# Patient Record
Sex: Male | Born: 1935 | Race: White | Hispanic: No | Marital: Married | Smoking: Former smoker
Health system: Southern US, Community
[De-identification: ages and names within clinical notes are randomized; demographics above are authoritative.]

## PROBLEM LIST (undated history)

## (undated) DIAGNOSIS — N2889 Other specified disorders of kidney and ureter: Secondary | ICD-10-CM

## (undated) DIAGNOSIS — F0390 Unspecified dementia without behavioral disturbance: Secondary | ICD-10-CM

## (undated) DIAGNOSIS — K4090 Unilateral inguinal hernia, without obstruction or gangrene, not specified as recurrent: Secondary | ICD-10-CM

## (undated) DIAGNOSIS — E785 Hyperlipidemia, unspecified: Secondary | ICD-10-CM

## (undated) DIAGNOSIS — E039 Hypothyroidism, unspecified: Secondary | ICD-10-CM

## (undated) DIAGNOSIS — E7800 Pure hypercholesterolemia, unspecified: Secondary | ICD-10-CM

## (undated) HISTORY — PX: PARTIAL NEPHRECTOMY: SHX414

## (undated) HISTORY — DX: Unspecified dementia, unspecified severity, without behavioral disturbance, psychotic disturbance, mood disturbance, and anxiety: F03.90

## (undated) HISTORY — PX: PROSTATECTOMY: SHX69

## (undated) HISTORY — PX: APPENDECTOMY: SHX54

## (undated) HISTORY — PX: BACK SURGERY: SHX140

## (undated) HISTORY — DX: Unilateral inguinal hernia, without obstruction or gangrene, not specified as recurrent: K40.90

---

## 2003-08-26 ENCOUNTER — Encounter (INDEPENDENT_AMBULATORY_CARE_PROVIDER_SITE_OTHER): Payer: Self-pay | Admitting: Specialist

## 2003-08-27 ENCOUNTER — Ambulatory Visit (HOSPITAL_COMMUNITY): Admission: RE | Admit: 2003-08-27 | Discharge: 2003-08-27 | Payer: Self-pay | Admitting: Gastroenterology

## 2004-11-24 ENCOUNTER — Emergency Department (HOSPITAL_COMMUNITY): Admission: EM | Admit: 2004-11-24 | Discharge: 2004-11-24 | Payer: Self-pay | Admitting: Emergency Medicine

## 2009-09-09 ENCOUNTER — Encounter: Admission: RE | Admit: 2009-09-09 | Discharge: 2009-09-09 | Payer: Self-pay | Admitting: Family Medicine

## 2009-12-01 ENCOUNTER — Inpatient Hospital Stay (HOSPITAL_COMMUNITY): Admission: RE | Admit: 2009-12-01 | Discharge: 2009-12-04 | Payer: Self-pay | Admitting: Urology

## 2009-12-01 ENCOUNTER — Encounter (INDEPENDENT_AMBULATORY_CARE_PROVIDER_SITE_OTHER): Payer: Self-pay | Admitting: Urology

## 2010-05-27 ENCOUNTER — Ambulatory Visit (HOSPITAL_COMMUNITY): Admission: RE | Admit: 2010-05-27 | Discharge: 2010-05-27 | Payer: Self-pay | Admitting: Urology

## 2010-11-25 ENCOUNTER — Ambulatory Visit (HOSPITAL_COMMUNITY)
Admission: RE | Admit: 2010-11-25 | Discharge: 2010-11-25 | Payer: Self-pay | Source: Home / Self Care | Attending: Urology | Admitting: Urology

## 2011-02-07 LAB — CBC
HCT: 42.5 % (ref 39.0–52.0)
Hemoglobin: 14.5 g/dL (ref 13.0–17.0)
MCHC: 34 g/dL (ref 30.0–36.0)
MCV: 88 fL (ref 78.0–100.0)
Platelets: 241 10*3/uL (ref 150–400)
RBC: 4.83 MIL/uL (ref 4.22–5.81)
RDW: 14.4 % (ref 11.5–15.5)
WBC: 7.2 10*3/uL (ref 4.0–10.5)

## 2011-02-07 LAB — BASIC METABOLIC PANEL
BUN: 17 mg/dL (ref 6–23)
BUN: 7 mg/dL (ref 6–23)
CO2: 26 mEq/L (ref 19–32)
CO2: 26 mEq/L (ref 19–32)
Calcium: 8.2 mg/dL — ABNORMAL LOW (ref 8.4–10.5)
Calcium: 8.6 mg/dL (ref 8.4–10.5)
Chloride: 102 mEq/L (ref 96–112)
Chloride: 104 mEq/L (ref 96–112)
Creatinine, Ser: 1.21 mg/dL (ref 0.4–1.5)
Creatinine, Ser: 1.3 mg/dL (ref 0.4–1.5)
GFR calc Af Amer: >60 mL/min (ref >60)
GFR calc Af Amer: >60 mL/min (ref >60)
GFR calc non Af Amer: 54 mL/min — ABNORMAL LOW (ref >60)
GFR calc non Af Amer: 59 mL/min — ABNORMAL LOW (ref >60)
Glucose, Bld: 212 mg/dL — ABNORMAL HIGH (ref 70–99)
Glucose, Bld: 93 mg/dL (ref 70–99)
Potassium: 3.9 mEq/L (ref 3.5–5.1)
Potassium: 4.2 mEq/L (ref 3.5–5.1)
Sodium: 132 mEq/L — ABNORMAL LOW (ref 135–145)
Sodium: 136 mEq/L (ref 135–145)

## 2011-02-07 LAB — BASIC METABOLIC PANEL WITH GFR
BUN: 11 mg/dL (ref 6–23)
BUN: 14 mg/dL (ref 6–23)
BUN: 16 mg/dL (ref 6–23)
CO2: 25 meq/L (ref 19–32)
CO2: 27 meq/L (ref 19–32)
CO2: 29 meq/L (ref 19–32)
Calcium: 8.3 mg/dL — ABNORMAL LOW (ref 8.4–10.5)
Calcium: 8.4 mg/dL (ref 8.4–10.5)
Calcium: 9.2 mg/dL (ref 8.4–10.5)
Chloride: 105 meq/L (ref 96–112)
Chloride: 108 meq/L (ref 96–112)
Chloride: 98 meq/L (ref 96–112)
Creatinine, Ser: 1.15 mg/dL (ref 0.4–1.5)
Creatinine, Ser: 1.26 mg/dL (ref 0.4–1.5)
Creatinine, Ser: 1.3 mg/dL (ref 0.4–1.5)
GFR calc non Af Amer: 54 mL/min — ABNORMAL LOW
GFR calc non Af Amer: 56 mL/min — ABNORMAL LOW
GFR calc non Af Amer: >60 mL/min
Glucose, Bld: 100 mg/dL — ABNORMAL HIGH (ref 70–99)
Glucose, Bld: 119 mg/dL — ABNORMAL HIGH (ref 70–99)
Glucose, Bld: 119 mg/dL — ABNORMAL HIGH (ref 70–99)
Potassium: 3.6 meq/L (ref 3.5–5.1)
Potassium: 3.8 meq/L (ref 3.5–5.1)
Potassium: 4.5 meq/L (ref 3.5–5.1)
Sodium: 129 meq/L — ABNORMAL LOW (ref 135–145)
Sodium: 135 meq/L (ref 135–145)
Sodium: 142 meq/L (ref 135–145)

## 2011-02-07 LAB — HEMOGLOBIN AND HEMATOCRIT, BLOOD
HCT: 35.5 % — ABNORMAL LOW (ref 39.0–52.0)
HCT: 36.3 % — ABNORMAL LOW (ref 39.0–52.0)
Hemoglobin: 12 g/dL — ABNORMAL LOW (ref 13.0–17.0)
Hemoglobin: 12.1 g/dL — ABNORMAL LOW (ref 13.0–17.0)

## 2011-02-07 LAB — TYPE AND SCREEN
ABO/RH(D): O NEG
Antibody Screen: NEGATIVE

## 2011-02-07 LAB — ABO/RH: ABO/RH(D): O NEG

## 2011-02-07 LAB — CREATININE, FLUID (PLEURAL, PERITONEAL, JP DRAINAGE): Creat, Fluid: 1 mg/dL

## 2011-04-09 NOTE — Op Note (Signed)
   NAME:  Ralph Jordan, TISDEL NO.:  [id number]   MEDICAL RECORD NO.:  [id number]                   PATIENT TYPE:  AMB   LOCATION:  ENDO                                 FACILITY:  [facility]   PHYSICIAN:  Graylin Shiver, M.D.                DATE OF BIRTH:  July 02, 1933   DATE OF PROCEDURE:  08/27/2003  DATE OF DISCHARGE:                                 OPERATIVE REPORT   PROCEDURE:  Colonoscopy with polypectomy and biopsy.   INDICATION FOR PROCEDURE:  Screening.   Informed consent was obtained after explanation of the risks of bleeding,  infection, and perforation.   PREMEDICATION:  Fentanyl 50 mcg IV, Versed 5 mg IV.   DESCRIPTION OF PROCEDURE:  With the patient in the left lateral decubitus  position, a rectal exam was performed and no masses were felt.  The Olympus  colonoscope was inserted into the rectum and advanced around the colon to  the cecum.  Upon advancement of the scope, I saw a 6 mm polyp which was  sessile in the descending colon, which was snared and removed by snare  cautery technique.  The cautery site looked good and the polyp was  retrieved.  After reaching the cecum and cecal landmarks were identified, I  noted two polyps in the cecum.  One was at the base of the cecum and was  about 2-3 mm in size.  This was biopsied off with cold forceps.  Also in the  cecum was a 6 mm sessile polyp, which was snared with a mini-snare and  removed by snare cautery technique with the cautery site turned down for  purposes of being in the cecum.  In the ascending colon there were two  polyps, one was a 6 mm sessile polyp, which was snared and removed by snare  cautery technique.  The cautery site looked good.  The other was a small 2-3  mm polyp, which was removed with the cold forceps.  The transverse colon  looked normal.  The descending colon site of previous polypectomy looked  good.  The sigmoid and rectum looked normal.  He tolerated the procedure  well without complications.   IMPRESSION:  Multiple colon polyps.   PLAN:  The pathology will be checked.                                               Graylin Shiver, M.D.    Germain Osgood  D:  08/27/2003  T:  08/27/2003  Job:  [id number]   cc:   Vikki Ports, M.D.  [address] [place]  [place]  [place] [address]  Fax: [phone]

## 2011-06-02 ENCOUNTER — Other Ambulatory Visit (HOSPITAL_COMMUNITY): Payer: Self-pay | Admitting: Urology

## 2011-06-02 ENCOUNTER — Ambulatory Visit (HOSPITAL_COMMUNITY)
Admission: RE | Admit: 2011-06-02 | Discharge: 2011-06-02 | Disposition: A | Discharge status: Home / Self Care | Payer: Medicare Other | Source: Ambulatory Visit | Attending: Urology | Admitting: Urology

## 2011-06-02 DIAGNOSIS — I517 Cardiomegaly: Secondary | ICD-10-CM | POA: Insufficient documentation

## 2011-06-02 DIAGNOSIS — R0602 Shortness of breath: Secondary | ICD-10-CM | POA: Insufficient documentation

## 2011-06-02 DIAGNOSIS — Z87891 Personal history of nicotine dependence: Secondary | ICD-10-CM | POA: Insufficient documentation

## 2011-06-02 DIAGNOSIS — R109 Unspecified abdominal pain: Secondary | ICD-10-CM | POA: Insufficient documentation

## 2011-06-02 DIAGNOSIS — Z85528 Personal history of other malignant neoplasm of kidney: Secondary | ICD-10-CM | POA: Insufficient documentation

## 2011-12-02 ENCOUNTER — Ambulatory Visit (HOSPITAL_COMMUNITY)
Admission: RE | Admit: 2011-12-02 | Discharge: 2011-12-02 | Disposition: A | Discharge status: Home / Self Care | Payer: Medicare Other | Source: Ambulatory Visit | Attending: Urology | Admitting: Urology

## 2011-12-02 ENCOUNTER — Other Ambulatory Visit (HOSPITAL_COMMUNITY): Payer: Self-pay | Admitting: Urology

## 2011-12-02 DIAGNOSIS — I10 Essential (primary) hypertension: Secondary | ICD-10-CM | POA: Insufficient documentation

## 2011-12-02 DIAGNOSIS — F17200 Nicotine dependence, unspecified, uncomplicated: Secondary | ICD-10-CM | POA: Insufficient documentation

## 2011-12-02 DIAGNOSIS — C649 Malignant neoplasm of unspecified kidney, except renal pelvis: Secondary | ICD-10-CM

## 2011-12-03 MED ORDER — DIPHENHYDRAMINE HCL 50 MG/ML IJ SOLN
INTRAMUSCULAR | Status: AC
  Filled 2011-12-03: qty 1

## 2011-12-03 MED ORDER — FENTANYL CITRATE 0.05 MG/ML IJ SOLN
INTRAMUSCULAR | Status: AC
  Filled 2011-12-03: qty 2

## 2011-12-03 MED ORDER — MIDAZOLAM HCL 10 MG/2ML IJ SOLN
INTRAMUSCULAR | Status: AC
  Filled 2011-12-03: qty 2

## 2012-03-08 ENCOUNTER — Emergency Department (HOSPITAL_COMMUNITY): Payer: Medicare Other

## 2012-03-08 ENCOUNTER — Emergency Department (HOSPITAL_COMMUNITY)
Admission: EM | Admit: 2012-03-08 | Discharge: 2012-03-08 | Disposition: A | Discharge status: Home / Self Care | Payer: Medicare Other | Attending: Emergency Medicine | Admitting: Emergency Medicine

## 2012-03-08 ENCOUNTER — Encounter (HOSPITAL_COMMUNITY): Payer: Self-pay | Admitting: Emergency Medicine

## 2012-03-08 DIAGNOSIS — E039 Hypothyroidism, unspecified: Secondary | ICD-10-CM | POA: Insufficient documentation

## 2012-03-08 DIAGNOSIS — G319 Degenerative disease of nervous system, unspecified: Secondary | ICD-10-CM | POA: Insufficient documentation

## 2012-03-08 DIAGNOSIS — E7800 Pure hypercholesterolemia, unspecified: Secondary | ICD-10-CM | POA: Insufficient documentation

## 2012-03-08 DIAGNOSIS — H81399 Other peripheral vertigo, unspecified ear: Secondary | ICD-10-CM | POA: Insufficient documentation

## 2012-03-08 DIAGNOSIS — Z79899 Other long term (current) drug therapy: Secondary | ICD-10-CM | POA: Insufficient documentation

## 2012-03-08 DIAGNOSIS — E785 Hyperlipidemia, unspecified: Secondary | ICD-10-CM | POA: Insufficient documentation

## 2012-03-08 DIAGNOSIS — R209 Unspecified disturbances of skin sensation: Secondary | ICD-10-CM | POA: Insufficient documentation

## 2012-03-08 HISTORY — DX: Other specified disorders of kidney and ureter: N28.89

## 2012-03-08 HISTORY — DX: Hypothyroidism, unspecified: E03.9

## 2012-03-08 HISTORY — DX: Hyperlipidemia, unspecified: E78.5

## 2012-03-08 HISTORY — DX: Pure hypercholesterolemia, unspecified: E78.00

## 2012-03-08 LAB — CBC
HCT: 43.3 % (ref 39.0–52.0)
Hemoglobin: 14.7 g/dL (ref 13.0–17.0)
MCH: 29.6 pg (ref 26.0–34.0)
MCHC: 33.9 g/dL (ref 30.0–36.0)
MCV: 87.1 fL (ref 78.0–100.0)
Platelets: 267 10*3/uL (ref 150–400)
RBC: 4.97 MIL/uL (ref 4.22–5.81)
RDW: 14.4 % (ref 11.5–15.5)
WBC: 5.5 10*3/uL (ref 4.0–10.5)

## 2012-03-08 LAB — BASIC METABOLIC PANEL
BUN: 17 mg/dL (ref 6–23)
CO2: 26 mEq/L (ref 19–32)
Calcium: 9.6 mg/dL (ref 8.4–10.5)
Chloride: 104 mEq/L (ref 96–112)
Creatinine, Ser: 1.29 mg/dL (ref 0.50–1.35)
GFR calc Af Amer: 60 mL/min — ABNORMAL LOW (ref >90)
GFR calc non Af Amer: 52 mL/min — ABNORMAL LOW (ref >90)
Glucose, Bld: 96 mg/dL (ref 70–99)
Potassium: 4.2 mEq/L (ref 3.5–5.1)
Sodium: 139 mEq/L (ref 135–145)

## 2012-03-08 LAB — DIFFERENTIAL
Basophils Absolute: 0.1 10*3/uL (ref 0.0–0.1)
Basophils Relative: 2 % — ABNORMAL HIGH (ref 0–1)
Eosinophils Absolute: 0.3 10*3/uL (ref 0.0–0.7)
Eosinophils Relative: 6 % — ABNORMAL HIGH (ref 0–5)
Lymphocytes Relative: 17 % (ref 12–46)
Lymphs Abs: 1 10*3/uL (ref 0.7–4.0)
Monocytes Absolute: 0.7 10*3/uL (ref 0.1–1.0)
Monocytes Relative: 12 % (ref 3–12)
Neutro Abs: 3.5 10*3/uL (ref 1.7–7.7)
Neutrophils Relative %: 63 % (ref 43–77)

## 2012-03-08 LAB — URINALYSIS, ROUTINE W REFLEX MICROSCOPIC
Bilirubin Urine: NEGATIVE
Glucose, UA: NEGATIVE mg/dL
Hgb urine dipstick: NEGATIVE
Ketones, ur: NEGATIVE mg/dL
Leukocytes, UA: NEGATIVE
Nitrite: NEGATIVE
Protein, ur: NEGATIVE mg/dL
Specific Gravity, Urine: 1.007 (ref 1.005–1.030)
Urobilinogen, UA: 0.2 mg/dL (ref 0.0–1.0)
pH: 6 (ref 5.0–8.0)

## 2012-03-08 LAB — TROPONIN I: Troponin I: <0.3 ng/mL (ref <0.30)

## 2012-03-08 MED ORDER — MECLIZINE HCL 25 MG PO TABS
50.0000 mg | ORAL_TABLET | Freq: Once | ORAL | Status: AC
  Administered 2012-03-08: 50 mg via ORAL
  Filled 2012-03-08: qty 2

## 2012-03-08 MED ORDER — SODIUM CHLORIDE 0.9 % IV SOLN
INTRAVENOUS | Status: DC
  Administered 2012-03-08: 09:00:00 via INTRAVENOUS

## 2012-03-08 MED ORDER — MECLIZINE HCL 25 MG PO TABS: 25.0000 mg | ORAL_TABLET | Freq: Three times a day (TID) | ORAL | Status: AC | PRN

## 2012-03-08 NOTE — Discharge Instructions (Signed)
RESOURCE GUIDE  Dental Problems  Patients with Medicaid: [facility]                     [facility] [address]                                           [address] Phone:  [phone]                                                  Phone:  [phone]  If unable to pay or uninsured, contact:  [facility] or [facility] to become qualified for the adult dental clinic.  Chronic Pain Problems Contact [facility]  [phone] Patients need to be referred by their primary care doctor.  Insufficient Money for Medicine Contact [employer]:  call "211" or [facility] 271-5999.  No Primary Care Doctor Call [facility]  832-8000 Other agencies that provide inexpensive medical care    [facility]  [phone]    [facility]  [phone]    [facility]  [phone]    Women[facility] Clinic  [phone]    [facility]  [phone]    [facility]  [phone]  Psychological Services [facility]  [phone] [facility]  [phone] [facility]   [phone] (emergency services [phone])  Substance Abuse Resources [facility]  336-882-2125 [facility] 336-784-9470 [facility] 336-285-9073 [facility] 336-845-3988 Residential & Outpatient Substance Abuse Program  800-659-3381  Abuse/Neglect [place] Child Abuse Hotline (336) 641-3795 [place] Child Abuse Hotline 800-378-5315 (After Hours)  Emergency Shelter [facility] (336) 271-5985  Maternity Homes [facility] (336) 275-9566 [facility] (704) 372-4663  MRSA Hotline #:   832-7006    [place] [facility]  [facility] [place] [facility]     [employer]                          [facility] [address] [place]                       [address]      [address]  [place]                                                [place]                            [place] Phone:  [phone]                                   Phone:  [phone]                 Phone:  [phone]  [facility] Phone:  [phone]    [facility] Child Abuse Hotline 330-077-0755 (346)172-2682 (After Hours)   Take the prescription as directed.  Call your regular medical doctor today to schedule a follow up appointment within the next 2 days.  Call the ENT doctor today to schedule a follow up appointment within the next week.  Return to the Emergency Department immediately sooner if worsening.

## 2012-03-08 NOTE — ED Provider Notes (Signed)
History     CSN: [id number]  Arrival date & time 03/08/12  0845   First MD Initiated Contact with Patient 03/08/12 [phone]      Chief Complaint  Patient presents with  . Dizziness   HPI Pt was seen at 0855.  Per pt, c/o sudden onset and persistence of constant "dizziness" that began after he woke up at 0600 this morning PTA.  Pt describes the "dizziness" as a sensation of movement and feeling "off balance" while walking.  States his symptoms worsen when he moves his head/eyes to the left, improves when laying or sitting still.  Pt also c/o gradual onset and persistence of constant right sided face "tingling" for the past several years.  Denies any change in this symptom today.  Endorses he has been seen in the ED and by his PMD for same approx 1 month ago, but does not remember what he was told it was.  Denies CP/palpitations, no SOB/cough, no fevers, no rash, no visual changes, no slurred speech, no facial droop, no focal motor weakness, no tingling/numbness in extremities, no syncope/near syncope, no head injury.    Past Medical History  Diagnosis Date  . Hyperlipidemia   . Left renal mass   . Hypothyroidism   . Hypercholesterolemia   . Hypothyroidism     Past Surgical History  Procedure Date  . Partial nephrectomy     left  . Appendectomy   . Back surgery   . Prostatectomy      History  Substance Use Topics  . Smoking status: Former Games developer  . Smokeless tobacco: Not on file  . Alcohol Use: Yes    Review of Systems ROS: Statement: All systems negative except as marked or noted in the HPI; Constitutional: Negative for fever and chills. ; ; Eyes: Negative for eye pain, redness and discharge. ; ; ENMT: Negative for ear pain, hoarseness, nasal congestion, sinus pressure and sore throat. ; ; Cardiovascular: Negative for chest pain, palpitations, diaphoresis, dyspnea and peripheral edema. ; ; Respiratory: Negative for cough, wheezing and stridor. ; ; Gastrointestinal: Negative for  nausea, vomiting, diarrhea, abdominal pain, blood in stool, hematemesis, jaundice and rectal bleeding. . ; ; Genitourinary: Negative for dysuria, flank pain and hematuria. ; ; Musculoskeletal: Negative for back pain and neck pain. Negative for swelling and trauma.; ; Skin: Negative for pruritus, rash, abrasions, blisters, bruising and skin lesion.; ; Neuro: +dizziness, ataxia.  Negative for headache, lightheadedness and neck stiffness. Negative for weakness, altered level of consciousness , altered mental status, extremity weakness, paresthesias, involuntary movement, seizure and syncope.     Allergies  Review of patient's allergies indicates not on file.  Home Medications  No current outpatient prescriptions on file.  BP 126/79  Pulse 62  Temp(Src) 97.6 F (36.4 C) (Oral)  Resp 14  SpO2 89%  Physical Exam 0900: Physical examination:  Nursing notes reviewed; Vital signs and O2 SAT reviewed;  Constitutional: Well developed, Well nourished, Well hydrated, In no acute distress; Head:  Normocephalic, atraumatic; Eyes: EOMI, PERRL, No scleral icterus; ENMT: TM's clear bilat.  Mouth and pharynx normal, Mucous membranes moist; Neck: Supple, Full range of motion, No lymphadenopathy; Cardiovascular: Regular rate and rhythm, No murmur, rub, or gallop; Respiratory: Breath sounds clear & equal bilaterally, No rales, rhonchi, wheezes, or rub, Normal respiratory effort/excursion; Chest: Nontender, Movement normal; Abdomen: Soft, Nontender, Nondistended, Normal bowel sounds; Extremities: Pulses normal, No tenderness, No edema, No calf edema or asymmetry.; Neuro: AA&Ox3, +very HOH, otherwise major CN grossly intact.  Strength 5/5 equal bilat UE's and LE's.  DTR 2/4 equal bilat UE's and LE's.  No gross sensory deficits to bilat face, bilat UE's or LE's.  Normal cerebellar testing bilat UE's and LE's.  No pronator drift.  Speech clear.  No facial droop.  +left gaze fatigable horizontal nystagmus which reproduces  pt's symptoms.;.; Skin: Color normal, Warm, Dry, no rash.     ED Course  Procedures  MDM  MDM Reviewed: nursing note and vitals Interpretation: labs, CT scan and ECG    Date: 03/08/2012  Rate: 57  Rhythm: normal sinus rhythm  QRS Axis: left  Intervals: PR prolonged  ST/T Wave abnormalities: normal  Conduction Disutrbances:first-degree A-V block  and left anterior fascicular block  Narrative Interpretation:   Old EKG Reviewed: none available.  Results for orders placed during the hospital encounter of 03/08/12  BASIC METABOLIC PANEL      Component Value Range   Sodium 139  135 - 145 (mEq/L)   Potassium 4.2  3.5 - 5.1 (mEq/L)   Chloride 104  96 - 112 (mEq/L)   CO2 26  19 - 32 (mEq/L)   Glucose, Bld 96  70 - 99 (mg/dL)   BUN 17  6 - 23 (mg/dL)   Creatinine, Ser 1.61  0.50 - 1.35 (mg/dL)   Calcium 9.6  8.4 - 09.6 (mg/dL)   GFR calc non Af Amer 52 (*) >90 (mL/min)   GFR calc Af Amer 60 (*) >90 (mL/min)  CBC      Component Value Range   WBC 5.5  4.0 - 10.5 (K/uL)   RBC 4.97  4.22 - 5.81 (MIL/uL)   Hemoglobin 14.7  13.0 - 17.0 (g/dL)   HCT 04.5  40.9 - 81.1 (%)   MCV 87.1  78.0 - 100.0 (fL)   MCH 29.6  26.0 - 34.0 (pg)   MCHC 33.9  30.0 - 36.0 (g/dL)   RDW 91.4  78.2 - 95.6 (%)   Platelets 267  150 - 400 (K/uL)  DIFFERENTIAL      Component Value Range   Neutrophils Relative 63  43 - 77 (%)   Neutro Abs 3.5  1.7 - 7.7 (K/uL)   Lymphocytes Relative 17  12 - 46 (%)   Lymphs Abs 1.0  0.7 - 4.0 (K/uL)   Monocytes Relative 12  3 - 12 (%)   Monocytes Absolute 0.7  0.1 - 1.0 (K/uL)   Eosinophils Relative 6 (*) 0 - 5 (%)   Eosinophils Absolute 0.3  0.0 - 0.7 (K/uL)   Basophils Relative 2 (*) 0 - 1 (%)   Basophils Absolute 0.1  0.0 - 0.1 (K/uL)  TROPONIN I      Component Value Range   Troponin I <0.30  <0.30 (ng/mL)  URINALYSIS, ROUTINE W REFLEX MICROSCOPIC      Component Value Range   Color, Urine YELLOW  YELLOW    APPearance CLEAR  CLEAR    Specific Gravity, Urine  1.007  1.005 - 1.030    pH 6.0  5.0 - 8.0    Glucose, UA NEGATIVE  NEGATIVE (mg/dL)   Hgb urine dipstick NEGATIVE  NEGATIVE    Bilirubin Urine NEGATIVE  NEGATIVE    Ketones, ur NEGATIVE  NEGATIVE (mg/dL)   Protein, ur NEGATIVE  NEGATIVE (mg/dL)   Urobilinogen, UA 0.2  0.0 - 1.0 (mg/dL)   Nitrite NEGATIVE  NEGATIVE    Leukocytes, UA NEGATIVE  NEGATIVE    Dg Chest 2 View 03/08/2012  *RADIOLOGY REPORT*  Clinical  Data: Dizziness  CHEST - 2 VIEW  Comparison: 12/02/2011  Findings: Lungs are clear. No pleural effusion or pneumothorax.  Heart is top normal in size, although the appearance is exacerbated by a pectus deformity.  Degenerative changes of the visualized thoracolumbar spine.  IMPRESSION: No evidence of acute cardiopulmonary disease.  Original Report Authenticated By: Charline Bills, M.D.   Ct Head Wo Contrast 03/08/2012  *RADIOLOGY REPORT*  Clinical Data: Dizziness, right facial numbness.  CT HEAD WITHOUT CONTRAST  Technique:  Contiguous axial images were obtained from the base of the skull through the vertex without contrast.  Comparison: 11/24/2004  Findings: There is atrophy and chronic small vessel disease changes. No acute intracranial abnormality.  Specifically, no hemorrhage, hydrocephalus, mass lesion, acute infarction, or significant intracranial injury.  No acute calvarial abnormality. Visualized paranasal sinuses and mastoids clear.  Orbital soft tissues unremarkable.  IMPRESSION: No acute intracranial abnormality.  Atrophy, chronic microvascular disease.  Original Report Authenticated By: Cyndie Chime, M.D.      1:02 PM:  Pt states he feels improved and wants to go home now.  He has ambulated in the ED with steady gait, easy resps; Spouse also agrees pt's gait is at his baseline.  VS are stable, not orthostatic.  Endorses right side of face has been "tingling" for "years now" and is no different today from his usual.  Denies focal motor weakness or numbness.  No sensory deficits  on physical exam.  Neuro exam continues intact.  Likely peripheral vertigo at this time, will tx symptomatically.  Dx testing d/w pt and family.  Questions answered.  Verb understanding, agreeable to d/c home with outpt f/u.                Laray Anger, DO 03/10/12 2215

## 2012-03-08 NOTE — ED Notes (Signed)
MD at bedside. Dr Marisue Ivan

## 2012-03-08 NOTE — ED Notes (Signed)
Pt states he woke up at 6am and was dizzy, got out of bed and was staggering with ambulation. Pt reports tingling to R side of face this am upon awakening but has had tingling to face for years. Pt denies SOB or CP.

## 2012-03-08 NOTE — ED Notes (Signed)
Pt wife states speech seems delayed when answering questions. Speech is clear.

## 2012-03-09 ENCOUNTER — Other Ambulatory Visit: Payer: Self-pay | Admitting: Family Medicine

## 2012-03-09 DIAGNOSIS — G459 Transient cerebral ischemic attack, unspecified: Secondary | ICD-10-CM

## 2012-03-09 LAB — URINE CULTURE
Colony Count: NO GROWTH
Culture  Setup Time: 201304171503
Culture: NO GROWTH

## 2012-03-10 ENCOUNTER — Other Ambulatory Visit: Payer: Medicare Other

## 2012-03-10 ENCOUNTER — Ambulatory Visit
Admission: RE | Admit: 2012-03-10 | Discharge: 2012-03-10 | Disposition: A | Discharge status: Home / Self Care | Payer: Medicare Other | Source: Ambulatory Visit | Attending: Family Medicine | Admitting: Family Medicine

## 2012-03-10 DIAGNOSIS — G459 Transient cerebral ischemic attack, unspecified: Secondary | ICD-10-CM

## 2012-03-11 ENCOUNTER — Other Ambulatory Visit: Payer: Medicare Other

## 2012-03-12 ENCOUNTER — Other Ambulatory Visit: Payer: Medicare Other

## 2012-03-12 ENCOUNTER — Ambulatory Visit
Admission: RE | Admit: 2012-03-12 | Discharge: 2012-03-12 | Disposition: A | Discharge status: Home / Self Care | Payer: Medicare Other | Source: Ambulatory Visit | Attending: Family Medicine | Admitting: Family Medicine

## 2012-03-12 DIAGNOSIS — G459 Transient cerebral ischemic attack, unspecified: Secondary | ICD-10-CM

## 2013-02-06 ENCOUNTER — Other Ambulatory Visit: Payer: Self-pay | Admitting: Family Medicine

## 2013-02-06 DIAGNOSIS — Z87891 Personal history of nicotine dependence: Secondary | ICD-10-CM

## 2013-02-12 ENCOUNTER — Ambulatory Visit
Admission: RE | Admit: 2013-02-12 | Discharge: 2013-02-12 | Disposition: A | Discharge status: Home / Self Care | Payer: Medicare Other | Source: Ambulatory Visit | Attending: Family Medicine | Admitting: Family Medicine

## 2013-02-12 DIAGNOSIS — Z87891 Personal history of nicotine dependence: Secondary | ICD-10-CM

## 2013-11-30 ENCOUNTER — Other Ambulatory Visit (HOSPITAL_COMMUNITY): Payer: Self-pay | Admitting: Urology

## 2013-11-30 ENCOUNTER — Ambulatory Visit (HOSPITAL_COMMUNITY)
Admission: RE | Admit: 2013-11-30 | Discharge: 2013-11-30 | Disposition: A | Discharge status: Home / Self Care | Payer: Medicare Other | Source: Ambulatory Visit | Attending: Urology | Admitting: Urology

## 2013-11-30 DIAGNOSIS — M954 Acquired deformity of chest and rib: Secondary | ICD-10-CM | POA: Insufficient documentation

## 2013-11-30 DIAGNOSIS — C649 Malignant neoplasm of unspecified kidney, except renal pelvis: Secondary | ICD-10-CM

## 2013-12-07 ENCOUNTER — Other Ambulatory Visit: Payer: Self-pay | Admitting: Family Medicine

## 2013-12-07 DIAGNOSIS — R439 Unspecified disturbances of smell and taste: Secondary | ICD-10-CM

## 2013-12-11 ENCOUNTER — Ambulatory Visit
Admission: RE | Admit: 2013-12-11 | Discharge: 2013-12-11 | Disposition: A | Discharge status: Home / Self Care | Payer: Medicare Other | Source: Ambulatory Visit | Attending: Family Medicine | Admitting: Family Medicine

## 2013-12-11 DIAGNOSIS — R439 Unspecified disturbances of smell and taste: Secondary | ICD-10-CM

## 2013-12-11 MED ORDER — GADOBENATE DIMEGLUMINE 529 MG/ML IV SOLN
14.0000 mL | Freq: Once | INTRAVENOUS | Status: AC | PRN
Start: 2013-12-11 — End: 2013-12-11

## 2013-12-11 MED ORDER — GADOBENATE DIMEGLUMINE 529 MG/ML IV SOLN
14.0000 mL | Freq: Once | INTRAVENOUS | Status: AC | PRN
  Administered 2013-12-11: 14 mL via INTRAVENOUS

## 2013-12-17 ENCOUNTER — Encounter: Payer: Self-pay | Admitting: Diagnostic Neuroimaging

## 2013-12-17 ENCOUNTER — Encounter (INDEPENDENT_AMBULATORY_CARE_PROVIDER_SITE_OTHER): Payer: Self-pay

## 2013-12-17 ENCOUNTER — Ambulatory Visit (INDEPENDENT_AMBULATORY_CARE_PROVIDER_SITE_OTHER): Payer: Medicare Other | Admitting: Diagnostic Neuroimaging

## 2013-12-17 VITALS — BP 111/70 | HR 60 | Temp 97.2°F | Ht 67.5 in | Wt 157.0 lb

## 2013-12-17 DIAGNOSIS — G3184 Mild cognitive impairment, so stated: Secondary | ICD-10-CM

## 2013-12-17 DIAGNOSIS — R413 Other amnesia: Secondary | ICD-10-CM

## 2013-12-17 MED ORDER — DONEPEZIL HCL 5 MG PO TABS: 5.0000 mg | ORAL_TABLET | Freq: Every day | ORAL | Status: DC

## 2013-12-17 NOTE — Progress Notes (Signed)
[facility]  PATIENT: Ralph Jordan DOB: [birth year]  REFERRING CLINICIAN: McNeill HISTORY FROM: patient and wife REASON FOR VISIT: new consult   HISTORICAL  CHIEF COMPLAINT:  Chief Complaint  Patient presents with  . Neurologic Problem    decline in mental function    HISTORY OF PRESENT ILLNESS:   80 year old male here for evaluation of memory problems and possible dementia.  According to the patient, he developed some memory loss and word finding difficulties over the past few months. Patient slices of this happens in summer 2014. Symptoms are worse with stress. Patient becomes frustrated when she is and conversation and struggling finding work. Patient's wife has to fill in when he is unable to remember where.  Patient has had difficulty with home projects recently including getting confused with a satellite TV receiver and electrical outlet replacement. Patient was eventually able to complete this task but he had great difficulty with. Over the next few days patient had increasing left frontal headaches, complaining to his wife that he felt there were "magnets in the low" which were causing electrical disturbance in his brain. He also was paranoid that the car was driving by and following him.  Faces and increasing anxiety as a result of these problems. Patient also having poor quality sleep with early awakening. There is strong family history of dementia in patient's mother and sister. Also family history of mood disturbance.  REVIEW OF SYSTEMS: Full 14 system review of systems performed and notable only for memory loss confusion headache insomnia dizziness tremor depression anxiety sleep.  ALLERGIES: No Known Allergies  HOME MEDICATIONS: Outpatient Prescriptions Prior to Visit  Medication Sig Dispense Refill  . aspirin EC 81 MG tablet Take 81 mg by mouth daily.      Marland Kitchen levothyroxine (SYNTHROID, LEVOTHROID) 112 MCG tablet Take 112 mcg by mouth daily.       . diazepam (VALIUM) 5 MG tablet Take 5 mg by mouth every 6 (six) hours as needed. anxiety      . ibuprofen (ADVIL,MOTRIN) 200 MG tablet Take 200 mg by mouth every 6 (six) hours as needed. pain      . naproxen sodium (ANAPROX) 220 MG tablet Take 220 mg by mouth 2 (two) times daily with a meal.      . simvastatin (ZOCOR) 40 MG tablet Take 40 mg by mouth daily.      Marland Kitchen zolpidem (AMBIEN) 10 MG tablet Take 10 mg by mouth at bedtime as needed. sleep       No facility-administered medications prior to visit.    PAST MEDICAL HISTORY: Past Medical History  Diagnosis Date  . Hyperlipidemia   . Left renal mass   . Hypothyroidism   . Hypercholesterolemia   . Hypothyroidism     PAST SURGICAL HISTORY: Past Surgical History  Procedure Laterality Date  . Partial nephrectomy      left  . Appendectomy    . Back surgery    . Prostatectomy      FAMILY HISTORY: Family History  Problem Relation Age of Onset  . Dementia Mother   . Aneurysm Father     brain  . Dementia Sister     SOCIAL HISTORY:  History   Social History  . Marital Status: Married    Spouse Name: Charlett Nose    Number of Children: 2  . Years of Education: BS   Occupational History  . Retired    Social History Main Topics  . Smoking status: Former Research scientist (life sciences)  .  Smokeless tobacco: Never Used  . Alcohol Use: Yes     Comment: 1 beer daily  . Drug Use: No  . Sexual Activity: Not on file   Other Topics Concern  . Not on file   Social History Narrative   Patient lives at home with his spouse.   Caffeine Use: 1 cup daily     PHYSICAL EXAM  Filed Vitals:   12/17/13 0920  BP: 111/70  Pulse: 60  Temp: 97.2 F (36.2 C)  TempSrc: Oral  Height: 5' 7.5" (1.715 m)  Weight: 157 lb (71.215 kg)    Not recorded    Body mass index is 24.21 kg/(m^2).  GENERAL EXAM: Patient is in no distress; well developed, nourished and groomed; neck is supple  CARDIOVASCULAR: Regular rate and rhythm, no murmurs, no carotid  bruits  NEUROLOGIC: MENTAL STATUS: awake, alert, oriented to person, place and time, recent and remote memory intact, normal attention and concentration, language fluent, comprehension intact, naming intact, fund of knowledge appropriate; MMSE 27/30. MOCA 20/30. POSITIVE MYERSONS AND PALMOMENTAL REFLEXES. NEG SNOUT.  CRANIAL NERVE: no papilledema on fundoscopic exam, pupils equal and reactive to light, visual fields full to confrontation, extraocular muscles intact, no nystagmus, facial sensation and strength symmetric, hearing intact, palate elevates symmetrically, uvula midline, shoulder shrug symmetric, tongue midline. MOTOR: normal bulk and tone, full strength in the BUE, BLE SENSORY: normal and symmetric to light touch, pinprick, temperature, vibration COORDINATION: finger-nose-finger, fine finger movements, heel-shin normal REFLEXES: deep tendon reflexes present and symmetric GAIT/STATION: narrow based gait; able to walk on toes, heels; DIFF WITH TANDEM, Romberg is negative    DIAGNOSTIC DATA (LABS, IMAGING, TESTING) - I reviewed patient records, labs, notes, testing and imaging myself where available.  Lab Results  Component Value Date   WBC 5.5 03/08/2012   HGB 14.7 03/08/2012   HCT 43.3 03/08/2012   MCV 87.1 03/08/2012   PLT 267 03/08/2012      Component Value Date/Time   NA 139 03/08/2012 0917   K 4.2 03/08/2012 0917   CL 104 03/08/2012 0917   CO2 26 03/08/2012 0917   GLUCOSE 96 03/08/2012 0917   BUN 17 03/08/2012 0917   CREATININE 1.29 03/08/2012 0917   CALCIUM 9.6 03/08/2012 0917   GFRNONAA 52* 03/08/2012 0917   GFRAA 60* 03/08/2012 0917   No results found for this basename: CHOL, HDL, LDLCALC, LDLDIRECT, TRIG, CHOLHDL   No results found for this basename: HGBA1C   No results found for this basename: VITAMINB12   No results found for this basename: TSH   I reviewed images myself and agree with interpretation.   12/11/13 MRI brain - moderate temporal atrophy; mild-mod  chronic small vessel ischemic disease   ASSESSMENT AND PLAN  80 y.o. year old male here with progressive short-term memory problems, language difficulty, anxiety and insomnia. Symptoms and exam are concerning for possible neurodegenerative process. MMSE 27/30. MOCA 20/30. Extensive time spent with the patient and his wife answering questions, treatment options, prognosis. We'll check B12 level. Last TSH was unremarkable. MRI brain shows significant atrophy small vessel disease which can be contributory to patient's memory problems.  Ddx: MCI vs mild dementia  PLAN: - donepezil 5mg  qhs - melatonin at bedtime for sleep - consider SSRI for anxiety - health habits reviewed (diet, exercise, social/brain stimulation)   Orders Placed This Encounter  Procedures  . Vitamin B12    Return in about 4 months (around 04/16/2014).    Penni Bombard, MD 12/17/2013, 11:01 AM  Certified in Neurology, Neurophysiology and [facility] [address] [place], [place] [address] [phone]

## 2013-12-17 NOTE — Patient Instructions (Signed)
Mild Neurocognitive Disorder Mild neurocognitive disorder (formerly known as mild cognitive impairment) is a mental disorder. It is a slight abnormal decrease in mental function. The areas of mental function affected may include memory, thought, communication, behavior, and completion of tasks. The decrease is noticeable and measurable but does not interfere substantially with your daily activities. Mild neurocognitive disorder typically occurs in people older than 60 years but can occur earlier. It is not as serious as major neurocognitive disorder (formerly known as dementia) but may lead to a more serious neurocognitive disorder. However, in some cases the condition does not progress. A few people with mild neurocognitive disorder even improve. CAUSES  There are a number of different causes of mild neurocognitive disorder:   Brain disorders associated with abnormal protein deposits, such as Alzheimer disease, Pick disease, and Lewy body disease.  Brain disorders associated with abnormal movement, such as Parkinson disease and Huntington disease.  Diseases affecting blood vessels in the brain and resulting in mini-strokes.  Certain infections such as human immunodeficiency virus (HIV) infection.  Traumatic brain injury.  Other medical conditions such as brain tumors, underactive thyroid (hypothyroidism), and vitamin B12 deficiency.  Use of certain prescription medicine and "recreational" drugs. SYMPTOMS  Symptoms of mild neurocognitive disorder include:  Difficulty remembering You may forget details of recent events, names, or phone numbers. You may forget important social events and appointments or repeatedly forget where you put your car keys.  Difficulty thinking and solving problems You may have difficulty with complex tasks such as paying bills or driving in unfamiliar locations.  Difficulty communicating You may have difficulty finding the right word, naming an object, forming a  sentence that makes sense, or understanding what you read or hear.  Changes in your behavior or personality You may lose interest in the things that you used to enjoy or withdraw from social situations. You may get angry more easily than usual. You may act before thinking. You may do things in public that you would not usually do. You may hear or see things that are not real (hallucinations). You may believe falsely that others are trying to hurt you (paranoia). DIAGNOSIS Mild neurocognitive disorder is diagnosed through an assessment by your health care provider. Your health care provider will ask you and your family, friends, or coworkers questions about your symptoms, their frequency, their duration and progression, and the effect they are having on your life. Your health care provider may refer you to a neurologist or mental health specialist for a detailed evaluation of your mental functions (neuropsychological testing).  To identify the cause of your mild neurocognitive disorder, your health care provider may:  Obtain a detailed medical history.  Ask about alcohol and drug use, including prescription medicine.  Perform a physical exam.  Order blood tests and brain imaging exams. TREATMENT  Mild neurocognitive disorder caused by infections, use of certain medicines or recreational drugs, and certain medical conditions may improve with treatment of the condition that is causing mild neurocognitive disorder. Mild neurocognitive disorder resulting from other causes generally does not improve and may worsen. In these cases, the goal of treatment is to slow progression of the disorder and help you cope with the loss of cognitive function. Treatments in these cases include:   Medicine Medication helps mainly with memory loss and behavioral symptoms.   Talk therapy Talk therapy provides education, emotional support, memory aids, and other ways of compensating for decreases in mental function.    Lifestyle changes These include regular exercise,  a healthy diet (including essential omega-3 fatty acids), intellectual stimulation, and increased social interaction. Document Released: 07/11/2013 Document Reviewed: 07/11/2013 [facility] Patient Information 2014 [place].

## 2013-12-18 LAB — VITAMIN B12: Vitamin B-12: 437 pg/mL (ref 211–946)

## 2013-12-24 ENCOUNTER — Telehealth: Payer: Self-pay | Admitting: Diagnostic Neuroimaging

## 2013-12-24 NOTE — Telephone Encounter (Signed)
Patient's wife calling to request B12 bloodwork results. Please call patient back.

## 2013-12-24 NOTE — Telephone Encounter (Signed)
Patient is requesting his B12 results. Please advise

## 2014-01-10 NOTE — Telephone Encounter (Signed)
Called patient to inform him of his normal B-12 blood work, patient expressed understanding no further questions.

## 2014-01-17 ENCOUNTER — Emergency Department (HOSPITAL_COMMUNITY)
Admission: EM | Admit: 2014-01-17 | Discharge: 2014-01-17 | Disposition: A | Discharge status: Home / Self Care | Payer: Medicare Other | Source: Home / Self Care

## 2014-01-17 ENCOUNTER — Encounter (HOSPITAL_COMMUNITY): Payer: Self-pay | Admitting: Emergency Medicine

## 2014-01-17 DIAGNOSIS — S0100XA Unspecified open wound of scalp, initial encounter: Secondary | ICD-10-CM

## 2014-01-17 DIAGNOSIS — S0101XA Laceration without foreign body of scalp, initial encounter: Secondary | ICD-10-CM

## 2014-01-17 DIAGNOSIS — W009XXA Unspecified fall due to ice and snow, initial encounter: Secondary | ICD-10-CM

## 2014-01-17 MED ORDER — TETANUS-DIPHTH-ACELL PERTUSSIS 5-2.5-18.5 LF-MCG/0.5 IM SUSP
INTRAMUSCULAR | Status: AC
  Filled 2014-01-17: qty 0.5

## 2014-01-17 MED ORDER — TETANUS-DIPHTH-ACELL PERTUSSIS 5-2.5-18.5 LF-MCG/0.5 IM SUSP
0.5000 mL | Freq: Once | INTRAMUSCULAR | Status: AC
  Administered 2014-01-17: 0.5 mL via INTRAMUSCULAR

## 2014-01-17 NOTE — ED Notes (Signed)
Patient states was shoveling snow today and fell backwards Hitting top of head on a chair with metal arms Not sure of when he received his last tetanus vaccine

## 2014-01-17 NOTE — ED Notes (Signed)
No loss on consciousness Paramedics were called but determined he could be seen here

## 2014-01-17 NOTE — Discharge Instructions (Signed)
Laceration Care, Adult °A laceration is a cut or lesion that goes through all layers of the skin and into the tissue just beneath the skin. °TREATMENT  °Some lacerations may not require closure. Some lacerations may not be able to be closed due to an increased risk of infection. It is important to see your caregiver as soon as possible after an injury to minimize the risk of infection and maximize the opportunity for successful closure. °If closure is appropriate, pain medicines may be given, if needed. The wound will be cleaned to help prevent infection. Your caregiver will use stitches (sutures), staples, wound glue (adhesive), or skin adhesive strips to repair the laceration. These tools bring the skin edges together to allow for faster healing and a better cosmetic outcome. However, all wounds will heal with a scar. Once the wound has healed, scarring can be minimized by covering the wound with sunscreen during the day for 1 full year. °HOME CARE INSTRUCTIONS  °For sutures or staples: °· Keep the wound clean and dry. °· If you were given a bandage (dressing), you should change it at least once a day. Also, change the dressing if it becomes wet or dirty, or as directed by your caregiver. °· Wash the wound with soap and water 2 times a day. Rinse the wound off with water to remove all soap. Pat the wound dry with a clean towel. °· After cleaning, apply a thin layer of the antibiotic ointment as recommended by your caregiver. This will help prevent infection and keep the dressing from sticking. °· You may shower as usual after the first 24 hours. Do not soak the wound in water until the sutures are removed. °· Only take over-the-counter or prescription medicines for pain, discomfort, or fever as directed by your caregiver. °· Get your sutures or staples removed as directed by your caregiver. °For skin adhesive strips: °· Keep the wound clean and dry. °· Do not get the skin adhesive strips wet. You may bathe  carefully, using caution to keep the wound dry. °· If the wound gets wet, pat it dry with a clean towel. °· Skin adhesive strips will fall off on their own. You may trim the strips as the wound heals. Do not remove skin adhesive strips that are still stuck to the wound. They will fall off in time. °For wound adhesive: °· You may briefly wet your wound in the shower or bath. Do not soak or scrub the wound. Do not swim. Avoid periods of heavy perspiration until the skin adhesive has fallen off on its own. After showering or bathing, gently pat the wound dry with a clean towel. °· Do not apply liquid medicine, cream medicine, or ointment medicine to your wound while the skin adhesive is in place. This may loosen the film before your wound is healed. °· If a dressing is placed over the wound, be careful not to apply tape directly over the skin adhesive. This may cause the adhesive to be pulled off before the wound is healed. °· Avoid prolonged exposure to sunlight or tanning lamps while the skin adhesive is in place. Exposure to ultraviolet light in the first year will darken the scar. °· The skin adhesive will usually remain in place for 5 to 10 days, then naturally fall off the skin. Do not pick at the adhesive film. °You may need a tetanus shot if: °· You cannot remember when you had your last tetanus shot. °· You have never had a tetanus   shot. If you get a tetanus shot, your arm may swell, get red, and feel warm to the touch. This is common and not a problem. If you need a tetanus shot and you choose not to have one, there is a rare chance of getting tetanus. Sickness from tetanus can be serious. SEEK MEDICAL CARE IF:   You have redness, swelling, or increasing pain in the wound.  You see a red line that goes away from the wound.  You have yellowish-white fluid (pus) coming from the wound.  You have a fever.  You notice a bad smell coming from the wound or dressing.  Your wound breaks open before or  after sutures have been removed.  You notice something coming out of the wound such as wood or glass.  Your wound is on your hand or foot and you cannot move a finger or toe. SEEK IMMEDIATE MEDICAL CARE IF:   Your pain is not controlled with prescribed medicine.  You have severe swelling around the wound causing pain and numbness or a change in color in your arm, hand, leg, or foot.  Your wound splits open and starts bleeding.  You have worsening numbness, weakness, or loss of function of any joint around or beyond the wound.  You develop painful lumps near the wound or on the skin anywhere on your body. MAKE SURE YOU:   Understand these instructions.  Will watch your condition.  Will get help right away if you are not doing well or get worse. Document Released: 11/08/2005 Document Revised: 01/31/2012 Document Reviewed: 05/04/2011 [facility] Patient Information 2014 [place], [place].  Head Injury, Adult You have received a head injury. It does not appear serious at this time. Headaches and vomiting are common following head injury. It should be easy to awaken from sleeping. Sometimes it is necessary for you to stay in the emergency department for a while for observation. Sometimes admission to the hospital may be needed. After injuries such as yours, most problems occur within the first 24 hours, but side effects may occur up to 7 10 days after the injury. It is important for you to carefully monitor your condition and contact your health care provider or seek immediate medical care if there is a change in your condition. WHAT ARE THE TYPES OF HEAD INJURIES? Head injuries can be as minor as a bump. Some head injuries can be more severe. More severe head injuries include:  A jarring injury to the brain (concussion).  A bruise of the brain (contusion). This mean there is bleeding in the brain that can cause swelling.  A cracked skull (skull fracture).  Bleeding in the brain that  collects, clots, and forms a bump (hematoma). WHAT CAUSES A HEAD INJURY? A serious head injury is most likely to happen to someone who is in a car wreck and is not wearing a seat belt. Other causes of major head injuries include bicycle or motorcycle accidents, sports injuries, and falls. HOW ARE HEAD INJURIES DIAGNOSED? A complete history of the event leading to the injury and your current symptoms will be helpful in diagnosing head injuries. Many times, pictures of the brain, such as CT or MRI are needed to see the extent of the injury. Often, an overnight hospital stay is necessary for observation.  WHEN SHOULD I SEEK IMMEDIATE MEDICAL CARE?  You should get help right away if:  You have confusion or drowsiness.  You feel sick to your stomach (nauseous) or have continued, forceful vomiting.  You have dizziness or unsteadiness that is getting worse.  You have severe, continued headaches not relieved by medicine. Only take over-the-counter or prescription medicines for pain, fever, or discomfort as directed by your health care provider.  You do not have normal function of the arms or legs or are unable to walk.  You notice changes in the black spots in the center of the colored part of your eye (pupil).  You have a clear or bloody fluid coming from your nose or ears.  You have a loss of vision. During the next 24 hours after the injury, you must stay with someone who can watch you for the warning signs. This person should contact local emergency services (911 in the [place] if you have seizures, you become unconscious, or you are unable to wake up. HOW CAN I PREVENT A HEAD INJURY IN THE FUTURE? The most important factor for preventing major head injuries is avoiding motor vehicle accidents. To minimize the potential for damage to your head, it is crucial to wear seat belts while riding in motor vehicles. Wearing helmets while bike riding and playing collision sports (like football) is also  helpful. Also, avoiding dangerous activities around the house will further help reduce your risk of head injury.  WHEN CAN I RETURN TO NORMAL ACTIVITIES AND ATHLETICS? You should be reevaluated by your health care provider before returning to these activities. If you have any of the following symptoms, you should not return to activities or contact sports until 1 week after the symptoms have stopped:  Persistent headache.  Dizziness or vertigo.  Poor attention and concentration.  Confusion.  Memory problems.  Nausea or vomiting.  Fatigue or tire easily.  Irritability.  Intolerant of bright lights or loud noises.  Anxiety or depression.  Disturbed sleep. MAKE SURE YOU:   Understand these instructions.  Will watch your condition.  Will get help right away if you are not doing well or get worse. Document Released: 11/08/2005 Document Revised: 08/29/2013 Document Reviewed: 07/16/2013 [facility] Patient Information 2014 [place].  Stitches, Staples, or Skin Adhesive Strips  Stitches (sutures), staples, and skin adhesive strips hold the skin together as it heals. They will usually be in place for 7 days or less. HOME CARE  Wash your hands with soap and water before and after you touch your wound.  Only take medicine as told by your doctor.  Cover your wound only if your doctor told you to. Otherwise, leave it open to air.  Do not get your stitches wet or dirty. If they get dirty, dab them gently with a clean washcloth. Wet the washcloth with soapy water. Do not rub. Pat them dry gently.  Do not put medicine or medicated cream on your stitches unless your doctor told you to.  Do not take out your own stitches or staples. Skin adhesive strips will fall off by themselves.  Do not pick at the wound. Picking can cause an infection.  Do not miss your follow-up appointment.  If you have problems or questions, call your doctor. GET HELP RIGHT AWAY IF:   You have a  temperature by mouth above 102 F (38.9 C), not controlled by medicine.  You have chills.  You have redness or pain around your stitches.  There is puffiness (swelling) around your stitches.  You notice fluid (drainage) from your stitches.  There is a bad smell coming from your wound. MAKE SURE YOU:  Understand these instructions.  Will watch your condition.  Will  get help if you are not doing well or get worse. Document Released: 09/05/2009 Document Revised: 01/31/2012 Document Reviewed: 09/05/2009 [facility] Patient Information 2014 [place]

## 2014-01-17 NOTE — ED Provider Notes (Signed)
CSN: [id number]     Arrival date & time 01/17/14  1425 History   First MD Initiated Contact with Patient 01/17/14 1457     Chief Complaint  Patient presents with  . Head Laceration     (Consider location/radiation/quality/duration/timing/severity/associated sxs/prior Treatment) HPI Comments: 80 year old man was shoveling snow and fell backwards striking the vertex of his scalp on the arm chair outdoor furniture. This was a metal pipe chair. This produced a laceration with bleeding that frightened patient and wife. They called 911 and advised that otherwise he was stable and could go to the urgent care for laceration repair. There was no loss of consciousness, confusion, disorientation or memory problems. He did take a shower prior to arrival.   Past Medical History  Diagnosis Date  . Hyperlipidemia   . Left renal mass   . Hypothyroidism   . Hypercholesterolemia   . Hypothyroidism    Past Surgical History  Procedure Laterality Date  . Partial nephrectomy      left  . Appendectomy    . Back surgery    . Prostatectomy     Family History  Problem Relation Age of Onset  . Dementia Mother   . Aneurysm Father     brain  . Dementia Sister    History  Substance Use Topics  . Smoking status: Former Research scientist (life sciences)  . Smokeless tobacco: Never Used  . Alcohol Use: Yes     Comment: 1 beer daily    Review of Systems  Constitutional: Negative.   Skin: Positive for wound.       As per history of present illness  Neurological: Negative for dizziness, tremors, seizures, syncope, facial asymmetry, speech difficulty, weakness, light-headedness, numbness and headaches.  All other systems reviewed and are negative.      Allergies  Review of patient's allergies indicates no known allergies.  Home Medications   Current Outpatient Rx  Name  Route  Sig  Dispense  Refill  . aspirin EC 81 MG tablet   Oral   Take 81 mg by mouth daily.         . CRESTOR 40 MG tablet   Oral   Take 1  tablet by mouth daily.         Marland Kitchen donepezil (ARICEPT) 5 MG tablet   Oral   Take 1 tablet (5 mg total) by mouth at bedtime.   30 tablet   12   . levothyroxine (SYNTHROID, LEVOTHROID) 112 MCG tablet   Oral   Take 112 mcg by mouth daily.          BP 133/75  Pulse 93  Temp(Src) 98.1 F (36.7 C) (Oral)  SpO2 100% Physical Exam  Nursing note and vitals reviewed. Constitutional: He is oriented to person, place, and time. He appears well-developed and well-nourished. He appears distressed.  Eyes: Conjunctivae and EOM are normal.  Neck: Normal range of motion. Neck supple.  Cardiovascular: Normal rate.   Pulmonary/Chest: Effort normal. No respiratory distress.  Neurological: He is alert and oriented to person, place, and time. No cranial nerve deficit. He exhibits normal muscle tone.  Skin: Skin is warm.  5 cm laceration to the scalp vertex. No current active bleeding.  Psychiatric: He has a normal mood and affect.    ED Course  LACERATION REPAIR Date/Time: 01/17/2014 4:10 PM Performed by: Marcha Dutton, Addysen Louth Authorized by: Philipp Deputy C Consent: Verbal consent obtained. Risks and benefits: risks, benefits and alternatives were discussed Consent given by: patient Patient understanding: patient states understanding  of the procedure being performed Patient identity confirmed: verbally with patient Body area: head/neck Location details: scalp Laceration length: 5 cm Foreign bodies: no foreign bodies Tendon involvement: none Nerve involvement: none Vascular damage: no Local anesthetic: lidocaine 2% with epinephrine Anesthetic total: 4 ml Irrigation solution: saline Irrigation method: syringe and tap Amount of cleaning: standard Debridement: none Degree of undermining: none Skin closure: staples Number of sutures: 5 Approximation: close Approximation difficulty: simple Patient tolerance: Patient tolerated the procedure well with no immediate complications.   (including  critical care time) Labs Review Labs Reviewed - No data to display Imaging Review No results found.    MDM   Final diagnoses:  Scalp laceration  Fall from slipping on ice      Clean wound post closure Tdap Staples  Watch for infection SR 7 days  Janne Napoleon, NP 01/17/14 1622

## 2014-01-18 NOTE — ED Provider Notes (Signed)
Medical screening examination/treatment/procedure(s) were performed by non-physician practitioner and as supervising physician I was immediately available for consultation/collaboration.  Philipp Deputy, M.D.   Harden Mo, MD 01/18/14 2300

## 2014-01-24 ENCOUNTER — Encounter (HOSPITAL_COMMUNITY): Payer: Self-pay | Admitting: Emergency Medicine

## 2014-01-24 ENCOUNTER — Emergency Department (INDEPENDENT_AMBULATORY_CARE_PROVIDER_SITE_OTHER)
Admission: EM | Admit: 2014-01-24 | Discharge: 2014-01-24 | Disposition: A | Discharge status: Home / Self Care | Payer: Medicare Other | Source: Home / Self Care | Attending: Family Medicine | Admitting: Family Medicine

## 2014-01-24 DIAGNOSIS — Z4802 Encounter for removal of sutures: Secondary | ICD-10-CM

## 2014-01-24 NOTE — Discharge Instructions (Signed)
Thank you for coming in today.  Staple Care and Removal Your caregiver has used staples today to repair your wound. Staples are used to help a wound heal faster by holding the edges of the wound together. The staples can be removed when the wound has healed well enough to stay together after the staples are removed. A dressing (wound covering), depending on the location of the wound, may have been applied. This may be changed once per day or as instructed. If the dressing sticks, it may be soaked off with soapy water or hydrogen peroxide. Only take over-the-counter or prescription medicines for pain, discomfort, or fever as directed by your caregiver.  If you did not receive a tetanus shot today because you did not recall when your last one was given, check with your caregiver when you have your staples removed to determine if one is needed. Return to your caregiver's office in 1 week or as suggested to have your staples removed. SEEK IMMEDIATE MEDICAL CARE IF:   You have redness, swelling, or increasing pain in the wound.  You have pus coming from the wound.  You have a fever.  You notice a bad smell coming from the wound or dressing.  Your wound edges break open after staples have been removed. Document Released: 08/03/2001 Document Revised: 01/31/2012 Document Reviewed: 08/18/2005 [facility] Patient Information 2014 [place].

## 2014-01-24 NOTE — ED Provider Notes (Signed)
Ralph Jordan is a 80 y.o. male who presents to Urgent Care today for suture removal. Patient suffered a scalp laceration was seen about one week ago. He had 5 staples placed on to the vertex of his scalp. He has done well in the past week. He he denies any lightheaded or dizziness fevers chills nausea vomiting or diarrhea.   Past Medical History  Diagnosis Date  . Hyperlipidemia   . Left renal mass   . Hypothyroidism   . Hypercholesterolemia   . Hypothyroidism    History  Substance Use Topics  . Smoking status: Former Research scientist (life sciences)  . Smokeless tobacco: Never Used  . Alcohol Use: Yes     Comment: 1 beer daily   ROS as above Medications: No current facility-administered medications for this encounter.   Current Outpatient Prescriptions  Medication Sig Dispense Refill  . aspirin EC 81 MG tablet Take 81 mg by mouth daily.      . CRESTOR 40 MG tablet Take 1 tablet by mouth daily.      Marland Kitchen donepezil (ARICEPT) 5 MG tablet Take 1 tablet (5 mg total) by mouth at bedtime.  30 tablet  12  . levothyroxine (SYNTHROID, LEVOTHROID) 112 MCG tablet Take 112 mcg by mouth daily.        Exam:  BP 107/79  Pulse 55  Temp(Src) 97.9 F (36.6 C) (Oral)  Resp 16  SpO2 100% Gen: Well NAD SCALP: Well-appearing wound. No erythema exudate or discharge. 5 intact staples were removed.    Assessment and Plan: 80 y.o. male with scalp laceration. Staples removed. Followup PRN  Discussed warning signs or symptoms. Please see discharge instructions. Patient expresses understanding.    Gregor Hams, MD 01/24/14 [phone]

## 2014-01-24 NOTE — ED Notes (Signed)
Wound appears well healed. No signs of infection.  Pt voices no concerns at this time.

## 2014-03-27 ENCOUNTER — Ambulatory Visit: Payer: Medicare Other | Admitting: Cardiology

## 2014-04-16 ENCOUNTER — Telehealth: Payer: Self-pay | Admitting: Diagnostic Neuroimaging

## 2014-04-16 ENCOUNTER — Ambulatory Visit (INDEPENDENT_AMBULATORY_CARE_PROVIDER_SITE_OTHER): Payer: Medicare Other | Admitting: Diagnostic Neuroimaging

## 2014-04-16 ENCOUNTER — Encounter: Payer: Self-pay | Admitting: Diagnostic Neuroimaging

## 2014-04-16 VITALS — BP 107/65 | HR 59 | Temp 97.9°F | Ht 69.0 in | Wt 160.0 lb

## 2014-04-16 DIAGNOSIS — G3184 Mild cognitive impairment, so stated: Secondary | ICD-10-CM | POA: Insufficient documentation

## 2014-04-16 DIAGNOSIS — R413 Other amnesia: Secondary | ICD-10-CM

## 2014-04-16 MED ORDER — DONEPEZIL HCL 10 MG PO TABS: 10.0000 mg | ORAL_TABLET | Freq: Every day | ORAL | Status: DC

## 2014-04-16 NOTE — Progress Notes (Signed)
[facility]  PATIENT: Ralph Jordan DOB: 1933/04/05  REFERRING CLINICIAN:  HISTORY FROM: patient and wife REASON FOR VISIT: follow up   HISTORICAL  CHIEF COMPLAINT:  Chief Complaint  Patient presents with  . Follow-up    rm 7,    HISTORY OF PRESENT ILLNESS:   UPDATE 04/16/14: Sinc last visit, doing well. Agitation, stress, frustration has improved. Memory is stable. Now exercising 3x per week. Plays golf 1x per week. Life overall seems to be "smoother" than previously. Takes donepezil 5mg  qhs. Also diazepam 1 tab every 2 weeks for agitation/stress.  PRIOR HPI (12/17/13): 80 year old male here for evaluation of memory problems and possible dementia. According to the patient, he developed some memory loss and word finding difficulties over the past few months. Patient's spouse has noticed this since summer 2014. Symptoms are worse with stress. Patient becomes frustrated when she is and conversation and struggling finding work. Patient's wife has to fill in when he is unable to remember where.  Patient has had difficulty with home projects recently including getting confused with a satellite TV receiver and electrical outlet replacement. Patient was eventually able to complete this task but he had great difficulty with. Over the next few days patient had increasing left frontal headaches, complaining to his wife that he felt there were "magnets in the wall" which were causing electrical disturbance in his brain. He also was paranoid that a car was driving by and following him.  Having increasing anxiety as a result of these problems. Patient also having poor quality sleep with early awakening. There is strong family history of dementia in patient's mother and sister. Also family history of mood disturbance.  REVIEW OF SYSTEMS: Full 14 system review of systems performed and notable only for memory loss speech diff anxiety RLS insomnia.   ALLERGIES: No Known  Allergies  HOME MEDICATIONS: Outpatient Prescriptions Prior to Visit  Medication Sig Dispense Refill  . aspirin EC 81 MG tablet Take 81 mg by mouth daily.      . CRESTOR 40 MG tablet Take 1 tablet by mouth daily.      Marland Kitchen donepezil (ARICEPT) 5 MG tablet Take 1 tablet (5 mg total) by mouth at bedtime.  30 tablet  12  . levothyroxine (SYNTHROID, LEVOTHROID) 112 MCG tablet Take 112 mcg by mouth daily.       No facility-administered medications prior to visit.    PAST MEDICAL HISTORY: Past Medical History  Diagnosis Date  . Hyperlipidemia   . Left renal mass   . Hypothyroidism   . Hypercholesterolemia   . Hypothyroidism     PAST SURGICAL HISTORY: Past Surgical History  Procedure Laterality Date  . Partial nephrectomy      left  . Appendectomy    . Back surgery    . Prostatectomy      FAMILY HISTORY: Family History  Problem Relation Age of Onset  . Dementia Mother   . Aneurysm Father     brain  . Dementia Sister     SOCIAL HISTORY:  History   Social History  . Marital Status: Married    Spouse Name: Charlett Nose    Number of Children: 2  . Years of Education: BS   Occupational History  . Retired    Social History Main Topics  . Smoking status: Former Research scientist (life sciences)  . Smokeless tobacco: Never Used  . Alcohol Use: Yes     Comment: 1 beer daily  . Drug Use: No  . Sexual Activity:  Not on file   Other Topics Concern  . Not on file   Social History Narrative   Patient lives at home with his spouse.   Caffeine Use: 1 cup daily     PHYSICAL EXAM  Filed Vitals:   04/16/14 0826  BP: 107/65  Pulse: 59  Temp: 97.9 F (36.6 C)  TempSrc: Oral  Height: 5\' 9"  (1.753 m)  Weight: 160 lb (72.576 kg)    Not recorded    Body mass index is 23.62 kg/(m^2).  GENERAL EXAM: Patient is in no distress; well developed, nourished and groomed; neck is supple  CARDIOVASCULAR: Regular rate and rhythm, no murmurs, no carotid bruits  NEUROLOGIC: MENTAL STATUS: awake, alert,  oriented to person, place and time, recent and remote memory intact, normal attention and concentration, language fluent, comprehension intact, naming intact, fund of knowledge appropriate; EXCEPT MMSE 27/30 (MISSES 1 ON DOCTOR, 2 ON RECALL). POSITIVE MYERSONS AND PALMOMENTAL REFLEXES. NEG SNOUT.  CRANIAL NERVE:  pupils equal and reactive to light, visual fields full to confrontation, extraocular muscles intact, no nystagmus, facial sensation and strength symmetric, hearing intact, palate elevates symmetrically, uvula midline, shoulder shrug symmetric, tongue midline. MOTOR: normal bulk and tone, full strength in the BUE, BLE SENSORY: normal and symmetric to light touch  COORDINATION: finger-nose-finger, fine finger movements normal REFLEXES: deep tendon reflexes present and symmetric GAIT/STATION: narrow based gait    DIAGNOSTIC DATA (LABS, IMAGING, TESTING) - I reviewed patient records, labs, notes, testing and imaging myself where available.  Lab Results  Component Value Date   WBC 5.5 03/08/2012   HGB 14.7 03/08/2012   HCT 43.3 03/08/2012   MCV 87.1 03/08/2012   PLT 267 03/08/2012      Component Value Date/Time   NA 139 03/08/2012 0917   K 4.2 03/08/2012 0917   CL 104 03/08/2012 0917   CO2 26 03/08/2012 0917   GLUCOSE 96 03/08/2012 0917   BUN 17 03/08/2012 0917   CREATININE 1.29 03/08/2012 0917   CALCIUM 9.6 03/08/2012 0917   GFRNONAA 52* 03/08/2012 0917   GFRAA 60* 03/08/2012 0917   No results found for this basename: CHOL,  HDL,  LDLCALC,  LDLDIRECT,  TRIG,  CHOLHDL   No results found for this basename: HGBA1C   Lab Results  Component Value Date   VITAMINB12 437 12/17/2013   No results found for this basename: TSH   I reviewed images myself and agree with interpretation.   12/11/13 MRI brain - moderate temporal atrophy; mild-mod chronic small vessel ischemic disease   ASSESSMENT AND PLAN  80 y.o. year old male here with progressive short-term memory problems, language  difficulty, anxiety and insomnia since summer 2014. Symptoms and exam are concerning for possible neurodegenerative process. Now stable on donepezil 5mg  qhs.   12/17/13: MMSE 27/30. MOCA 20/30.  04/16/14: MMSE 27/30  Ddx: MCI vs mild dementia  PLAN: - increase donepezil to 10mg  qhs - consider SSRI for anxiety - health habits reviewed (diet, exercise, social/brain stimulation)  Meds ordered this encounter  Medications  . donepezil (ARICEPT) 10 MG tablet    Sig: Take 1 tablet (10 mg total) by mouth at bedtime.    Dispense:  30 tablet    Refill:  12   Return in about 6 months (around 10/17/2014).    Penni Bombard, MD 3/32/9518, 8:41 AM Certified in Neurology, Neurophysiology and Neuroimaging  [facility] [address] [place], [place] [address] [phone]

## 2014-04-16 NOTE — Telephone Encounter (Signed)
Per OV note, patient's Aricept has been increased to 10mg .  Dr Leta Baptist sent a Rx today.  I called the pharmacy back.  Spoke with [place].  He is aware the dose sent was correct.

## 2014-04-16 NOTE — Patient Instructions (Signed)
Increase donepezil to 10mg  at bedtime.

## 2014-04-16 NOTE — Telephone Encounter (Signed)
Bobby with [facility] wanting to verify donepezil (ARICEPT) 10 MG tablet was changed from 5mg .  Please call and advise.  [phone]

## 2014-10-21 ENCOUNTER — Ambulatory Visit (INDEPENDENT_AMBULATORY_CARE_PROVIDER_SITE_OTHER): Payer: Medicare Other | Admitting: Diagnostic Neuroimaging

## 2014-10-21 ENCOUNTER — Encounter: Payer: Self-pay | Admitting: Diagnostic Neuroimaging

## 2014-10-21 VITALS — BP 98/61 | HR 55 | Temp 98.1°F | Ht 69.0 in | Wt 152.8 lb

## 2014-10-21 DIAGNOSIS — G3184 Mild cognitive impairment, so stated: Secondary | ICD-10-CM

## 2014-10-21 MED ORDER — MEMANTINE HCL 10 MG PO TABS: 10.0000 mg | ORAL_TABLET | Freq: Two times a day (BID) | ORAL | Status: DC

## 2014-10-21 NOTE — Patient Instructions (Signed)
Start memantine 10mg  daily x 1 week, then 10mg  twice a day.  Continue donepezil 10mg  at bedtime.

## 2014-10-21 NOTE — Progress Notes (Signed)
[facility]  PATIENT: Ralph Jordan DOB: 1933-10-29  REFERRING CLINICIAN:  HISTORY FROM: patient and wife REASON FOR VISIT: follow up   HISTORICAL  CHIEF COMPLAINT:  Chief Complaint  Patient presents with  . Follow-up    memory    HISTORY OF PRESENT ILLNESS:   UPDATE 10/21/14: Since last visit, memory loss overall stable, but some good and some bad days. Still exercising and staying active. Some frustration and diff with multi-step tasks. Still drives close to home (no accidents or getting lost). 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   UPDATE 04/16/14: Sinc last visit, doing well. Agitation, stress, frustration has improved. Memory is stable. Now exercising 3x per week. Plays golf 1x per week. Life overall seems to be "smoother" than previously. Takes donepezil 5mg  qhs. Also diazepam 1 tab every 2 weeks for agitation/stress.  PRIOR HPI (12/17/13): 80 year old male here for evaluation of memory problems and possible dementia. According to the patient, he developed some memory loss and word finding difficulties over the past few months. Patient's spouse has noticed this since summer 2014. Symptoms are worse with stress. Patient becomes frustrated when she is and conversation and struggling finding work. Patient's wife has to fill in when he is unable to remember words. Patient has had difficulty with home projects recently including getting confused with a satellite TV receiver and electrical outlet replacement. Patient was eventually able to complete this task but he had great difficulty with. Over the next few days patient had increasing left frontal headaches, complaining to his wife that he felt there were "magnets in the wall" which were causing electrical disturbance in his brain. He also was paranoid that a car was driving by and following him. Having increasing anxiety as a result of these problems. Patient also having poor quality sleep with early awakening. There  is strong family history of dementia in patient's mother and sister. Also family history of mood disturbance.  REVIEW OF SYSTEMS: Full 14 system review of systems performed and notable only for memory loss insomnia.   ALLERGIES: No Known Allergies  HOME MEDICATIONS: Outpatient Prescriptions Prior to Visit  Medication Sig Dispense Refill  . CRESTOR 40 MG tablet Take 1 tablet by mouth daily.    . diazepam (VALIUM) 5 MG tablet Take 5 mg by mouth every 6 (six) hours as needed for anxiety.    . donepezil (ARICEPT) 10 MG tablet Take 1 tablet (10 mg total) by mouth at bedtime. 30 tablet 12  . aspirin EC 81 MG tablet Take 81 mg by mouth daily.    . Levothyroxine Sodium (LEVOTHROID PO) Take 0.1 mg by mouth daily.     No facility-administered medications prior to visit.    PAST MEDICAL HISTORY: Past Medical History  Diagnosis Date  . Hyperlipidemia   . Left renal mass   . Hypothyroidism   . Hypercholesterolemia   . Hypothyroidism   . Hernia, inguinal, right     PAST SURGICAL HISTORY: Past Surgical History  Procedure Laterality Date  . Partial nephrectomy      left  . Appendectomy    . Back surgery    . Prostatectomy      FAMILY HISTORY: Family History  Problem Relation Age of Onset  . Dementia Mother   . Aneurysm Father     brain  . Dementia Sister     SOCIAL HISTORY:  History   Social History  . Marital Status: Married    Spouse Name: Charlett Nose  Number of Children: 2  . Years of Education: BS   Occupational History  . Retired    Social History Main Topics  . Smoking status: Former Research scientist (life sciences)  . Smokeless tobacco: Never Used  . Alcohol Use: Yes     Comment: 1 beer daily  . Drug Use: No  . Sexual Activity: Not on file   Other Topics Concern  . Not on file   Social History Narrative   Patient lives at home with his spouse.   Caffeine Use: 1 cup daily     PHYSICAL EXAM  Filed Vitals:   10/21/14 1438  BP: 98/61  Pulse: 55  Temp: 98.1 F (36.7 C)    TempSrc: Oral  Height: 5\' 9"  (1.753 m)  Weight: 152 lb 12.8 oz (69.31 kg)    Not recorded      Body mass index is 22.55 kg/(m^2).  No flowsheet data found.   Montreal Cognitive Assessment  10/21/2014  Visuospatial/ Executive (0/5) 1  Naming (0/3) 2  Attention: Read list of digits (0/2) 2  Attention: Read list of letters (0/1) 1  Attention: Serial 7 subtraction starting at 100 (0/3) 3  Language: Repeat phrase (0/2) 1  Language : Fluency (0/1) 1  Abstraction (0/2) 0  Delayed Recall (0/5) 0  Orientation (0/6) 5  Total 16  Adjusted Score (based on education) 16     GENERAL EXAM: Patient is in no distress; well developed, nourished and groomed; neck is supple  CARDIOVASCULAR: Regular rate and rhythm, no murmurs, no carotid bruits  NEUROLOGIC: MENTAL STATUS: awake, alert, oriented to person, place and time, recent and remote memory intact, normal attention and concentration, language fluent, comprehension intact, naming intact, fund of knowledge appropriate; POSITIVE MYERSONS AND PALMOMENTAL REFLEXES. NEG SNOUT.  CRANIAL NERVE:  pupils equal and reactive to light, visual fields full to confrontation, extraocular muscles intact, no nystagmus, facial sensation and strength symmetric, hearing intact, palate elevates symmetrically, uvula midline, shoulder shrug symmetric, tongue midline. MOTOR: normal bulk and tone, full strength in the BUE, BLE SENSORY: normal and symmetric to light touch  COORDINATION: finger-nose-finger, fine finger movements normal REFLEXES: deep tendon reflexes present and symmetric GAIT/STATION: narrow based gait    DIAGNOSTIC DATA (LABS, IMAGING, TESTING) - I reviewed patient records, labs, notes, testing and imaging myself where available.  Lab Results  Component Value Date   WBC 5.5 03/08/2012   HGB 14.7 03/08/2012   HCT 43.3 03/08/2012   MCV 87.1 03/08/2012   PLT 267 03/08/2012      Component Value Date/Time   NA 139 03/08/2012 0917   K  4.2 03/08/2012 0917   CL 104 03/08/2012 0917   CO2 26 03/08/2012 0917   GLUCOSE 96 03/08/2012 0917   BUN 17 03/08/2012 0917   CREATININE 1.29 03/08/2012 0917   CALCIUM 9.6 03/08/2012 0917   GFRNONAA 52* 03/08/2012 0917   GFRAA 60* 03/08/2012 0917   No results found for: CHOL No results found for: HGBA1C Lab Results  Component Value Date   [id number] 016 12/17/2013   No results found for: TSH I reviewed images myself and agree with interpretation.   12/11/13 MRI brain - moderate temporal atrophy; mild-mod chronic small vessel ischemic disease   ASSESSMENT AND PLAN  80 y.o. year old male here with progressive short-term memory problems, language difficulty, anxiety and insomnia since summer 2014. Symptoms and exam are concerning for possible neurodegenerative process. Now stable on donepezil 5mg  qhs.   12/17/13: MMSE 27/30. MOCA 20/30.  04/16/14: MMSE  27/30 10/21/14: [place] 16/30  Ddx: MCI vs mild dementia  PLAN: - add memantine 10mg  BID - continue donepezil to 10mg  qhs - health habits reviewed (diet, exercise, social/brain stimulation)  Meds ordered this encounter  Medications  . memantine (NAMENDA) 10 MG tablet    Sig: Take 1 tablet (10 mg total) by mouth 2 (two) times daily.    Dispense:  60 tablet    Refill:  12    Take 1 tab daily for 1 week, then increase to 1 tab twice a day.   Return in about 6 months (around 04/21/2015).    Penni Bombard, MD 88/89/1694, 5:03 PM Certified in Neurology, Neurophysiology and Neuroimaging  [facility] [address], [place], [place] [address] [phone]

## 2014-12-04 ENCOUNTER — Ambulatory Visit (HOSPITAL_COMMUNITY)
Admission: RE | Admit: 2014-12-04 | Discharge: 2014-12-04 | Disposition: A | Discharge status: Home / Self Care | Payer: Medicare Other | Source: Ambulatory Visit | Attending: Urology | Admitting: Urology

## 2014-12-04 ENCOUNTER — Other Ambulatory Visit (HOSPITAL_COMMUNITY): Payer: Self-pay | Admitting: Urology

## 2014-12-04 DIAGNOSIS — C649 Malignant neoplasm of unspecified kidney, except renal pelvis: Secondary | ICD-10-CM

## 2014-12-04 DIAGNOSIS — Z87891 Personal history of nicotine dependence: Secondary | ICD-10-CM | POA: Diagnosis not present

## 2015-04-23 ENCOUNTER — Encounter: Payer: Self-pay | Admitting: Diagnostic Neuroimaging

## 2015-04-23 ENCOUNTER — Ambulatory Visit (INDEPENDENT_AMBULATORY_CARE_PROVIDER_SITE_OTHER): Payer: Medicare Other | Admitting: Diagnostic Neuroimaging

## 2015-04-23 VITALS — BP 116/78 | HR 60 | Ht 69.0 in | Wt 153.6 lb

## 2015-04-23 DIAGNOSIS — G3184 Mild cognitive impairment, so stated: Secondary | ICD-10-CM

## 2015-04-23 MED ORDER — DONEPEZIL HCL 10 MG PO TABS: 10.0000 mg | ORAL_TABLET | Freq: Every day | ORAL | Status: DC

## 2015-04-23 MED ORDER — MEMANTINE HCL 10 MG PO TABS: 10.0000 mg | ORAL_TABLET | Freq: Two times a day (BID) | ORAL | Status: DC

## 2015-04-23 NOTE — Progress Notes (Signed)
[facility]  PATIENT: Ralph Jordan DOB: 1933/11/20  REFERRING CLINICIAN:  HISTORY FROM: patient and wife REASON FOR VISIT: follow up   HISTORICAL  CHIEF COMPLAINT:  Chief Complaint  Patient presents with  . Follow-up    memory loss     HISTORY OF PRESENT ILLNESS:   UPDATE 04/23/15: Since last visit, doing well. Overall stable. ADLs are stable.   UPDATE 10/21/14: Since last visit, memory loss overall stable, but some good and some bad days. Still exercising and staying active. Some frustration and diff with multi-step tasks. Still drives close to home (no accidents or getting lost). 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   UPDATE 04/16/14: Sinc last visit, doing well. Agitation, stress, frustration has improved. Memory is stable. Now exercising 3x per week. Plays golf 1x per week. Life overall seems to be "smoother" than previously. Takes donepezil 5mg  qhs. Also diazepam 1 tab every 2 weeks for agitation/stress.  PRIOR HPI (12/17/13): 81 year old male here for evaluation of memory problems and possible dementia. According to the patient, he developed some memory loss and word finding difficulties over the past few months. Patient's spouse has noticed this since summer 2014. Symptoms are worse with stress. Patient becomes frustrated when she is and conversation and struggling finding work. Patient's wife has to fill in when he is unable to remember words. Patient has had difficulty with home projects recently including getting confused with a satellite TV receiver and electrical outlet replacement. Patient was eventually able to complete this task but he had great difficulty with. Over the next few days patient had increasing left frontal headaches, complaining to his wife that he felt there were "magnets in the wall" which were causing electrical disturbance in his brain. He also was paranoid that a car was driving by and following him. Having increasing anxiety as a result  of these problems. Patient also having poor quality sleep with early awakening. There is strong family history of dementia in patient's mother and sister. Also family history of mood disturbance.  REVIEW OF SYSTEMS: Full 14 system review of systems performed and notable only for memory loss.   ALLERGIES: No Known Allergies  HOME MEDICATIONS: Outpatient Prescriptions Prior to Visit  Medication Sig Dispense Refill  . CRESTOR 40 MG tablet Take 1 tablet by mouth daily.    . diazepam (VALIUM) 5 MG tablet Take 5 mg by mouth every 6 (six) hours as needed for anxiety.    . donepezil (ARICEPT) 10 MG tablet Take 1 tablet (10 mg total) by mouth at bedtime. 30 tablet 12  . memantine (NAMENDA) 10 MG tablet Take 1 tablet (10 mg total) by mouth 2 (two) times daily. 60 tablet 12  . zolpidem (AMBIEN) 10 MG tablet Take 1 tablet by mouth at bedtime as needed.  0  . aspirin EC 81 MG tablet Take 81 mg by mouth daily.    Marland Kitchen levothyroxine (SYNTHROID, LEVOTHROID) 100 MCG tablet Take 1 tablet by mouth daily.     No facility-administered medications prior to visit.    PAST MEDICAL HISTORY: Past Medical History  Diagnosis Date  . Hyperlipidemia   . Left renal mass   . Hypothyroidism   . Hypercholesterolemia   . Hypothyroidism   . Hernia, inguinal, right     PAST SURGICAL HISTORY: Past Surgical History  Procedure Laterality Date  . Partial nephrectomy      left  . Appendectomy    . Back surgery    . Prostatectomy  FAMILY HISTORY: Family History  Problem Relation Age of Onset  . Dementia Mother   . Aneurysm Father     brain  . Dementia Sister     SOCIAL HISTORY:  History   Social History  . Marital Status: Married    Spouse Name: Ralph Jordan  . Number of Children: 2  . Years of Education: BS   Occupational History  . Retired    Social History Main Topics  . Smoking status: Former Research scientist (life sciences)  . Smokeless tobacco: Never Used  . Alcohol Use: Yes     Comment: 1 beer daily  . Drug Use:  No  . Sexual Activity: Not on file   Other Topics Concern  . Not on file   Social History Narrative   Patient lives at home with his spouse.   Caffeine Use: 1 cup daily     PHYSICAL EXAM  Filed Vitals:   04/23/15 1532  BP: 116/78  Pulse: 60  Height: 5\' 9"  (1.753 m)  Weight: 153 lb 9.6 oz (69.673 kg)    Not recorded      Body mass index is 22.67 kg/(m^2).  MMSE - Mini Mental State Exam 04/23/2015  Orientation to time 5  Orientation to Place 5  Registration 3  Attention/ Calculation 4  Recall 2  Language- name 2 objects 2  Language- repeat 1  Language- follow 3 step command 2  Language- read & follow direction 1  Write a sentence 1  Copy design 1  Total score 27     Montreal Cognitive Assessment  10/21/2014  Visuospatial/ Executive (0/5) 1  Naming (0/3) 2  Attention: Read list of digits (0/2) 2  Attention: Read list of letters (0/1) 1  Attention: Serial 7 subtraction starting at 100 (0/3) 3  Language: Repeat phrase (0/2) 1  Language : Fluency (0/1) 1  Abstraction (0/2) 0  Delayed Recall (0/5) 0  Orientation (0/6) 5  Total 16  Adjusted Score (based on education) 16     GENERAL EXAM: Patient is in no distress; well developed, nourished and groomed; neck is supple  CARDIOVASCULAR: Regular rate and rhythm, no murmurs, no carotid bruits  NEUROLOGIC: MENTAL STATUS: awake, alert, normal attention and concentration, language fluent, comprehension intact, naming intact, fund of knowledge appropriate; POSITIVE MYERSONS AND PALMOMENTAL REFLEXES. NEG SNOUT.  CRANIAL NERVE:  pupils equal and reactive to light, visual fields full to confrontation, extraocular muscles intact, no nystagmus, facial sensation and strength symmetric, hearing intact, palate elevates symmetrically, uvula midline, shoulder shrug symmetric, tongue midline. MOTOR: normal bulk and tone, full strength in the BUE, BLE SENSORY: normal and symmetric to light touch  COORDINATION:  finger-Jordan-finger, fine finger movements normal REFLEXES: deep tendon reflexes present and symmetric GAIT/STATION: narrow based gait    DIAGNOSTIC DATA (LABS, IMAGING, TESTING) - I reviewed patient records, labs, notes, testing and imaging myself where available.  Lab Results  Component Value Date   WBC 5.5 03/08/2012   HGB 14.7 03/08/2012   HCT 43.3 03/08/2012   MCV 87.1 03/08/2012   PLT 267 03/08/2012      Component Value Date/Time   NA 139 03/08/2012 0917   K 4.2 03/08/2012 0917   CL 104 03/08/2012 0917   CO2 26 03/08/2012 0917   GLUCOSE 96 03/08/2012 0917   BUN 17 03/08/2012 0917   CREATININE 1.29 03/08/2012 0917   CALCIUM 9.6 03/08/2012 0917   GFRNONAA 52* 03/08/2012 0917   GFRAA 60* 03/08/2012 0917   No results found for: CHOL No results  found for: HGBA1C Lab Results  Component Value Date   [id number] 283 12/17/2013   No results found for: TSH    12/11/13 MRI brain - moderate temporal atrophy; mild-mod chronic small vessel ischemic disease    ASSESSMENT AND PLAN  81 y.o. year old male here with progressive short-term memory problems, language difficulty, anxiety and insomnia since summer 2014. Symptoms and exam are concerning for possible neurodegenerative process. Now stable on donepezil 5mg  qhs.   12/17/13: MMSE 27/30. MOCA 20/30.  04/16/14: MMSE 27/30 10/21/14: [place] 16/30 04/23/15: MMSE 28/30  Ddx: MCI vs mild dementia   PLAN: - continue memantine 10mg  BID - continue donepezil to 10mg  qhs - patient and wife will look into cost of namzaric (combination pill) vs generic donepezil + memantine - health habits reviewed (diet, exercise, social/brain stimulation)  Return in about 1 year (around 04/22/2016).  I spent 15 minutes of face to face time with patient. Greater than 50% of time was spent in counseling and coordination of care with patient.      Penni Bombard, MD 11/27/1759, 6:07 PM Certified in Neurology, Neurophysiology and  Neuroimaging  [facility] [address], [place], [place] [address] [phone]

## 2015-04-23 NOTE — Patient Instructions (Signed)
Continue current medication.

## 2015-04-28 ENCOUNTER — Telehealth: Payer: Self-pay | Admitting: Diagnostic Neuroimaging

## 2015-04-28 MED ORDER — MEMANTINE HCL-DONEPEZIL HCL ER 28-10 MG PO CP24: 1.0000 | ORAL_CAPSULE | Freq: Every day | ORAL | Status: DC

## 2015-04-28 NOTE — Telephone Encounter (Signed)
rx sent in. -VRP 

## 2015-04-28 NOTE — Telephone Encounter (Signed)
Spoke to the pt wife on the phone, she told me that she found out through insurance that the combo pill she and Dr. Leta Baptist discussed would be affordable. She wants him to write it and send a three month supply to [facility]. i told her i would talk with him. She thanked me

## 2015-04-28 NOTE — Telephone Encounter (Signed)
Patients wife called and requested to speak with the nurse regarding some changes in his medication. Rx.memantine (NAMENDA) 10 MG tablet and  Rx. donepezil (ARICEPT) 10 MG tablet are the medications he is currently taking but she  Would like to discuss having them change. Please call and advise.

## 2015-04-29 ENCOUNTER — Telehealth: Payer: Self-pay | Admitting: *Deleted

## 2015-04-29 ENCOUNTER — Telehealth: Payer: Self-pay | Admitting: Diagnostic Neuroimaging

## 2015-04-29 MED ORDER — MEMANTINE HCL-DONEPEZIL HCL ER 28-10 MG PO CP24: 1.0000 | ORAL_CAPSULE | Freq: Every day | ORAL | Status: DC

## 2015-04-29 NOTE — Telephone Encounter (Signed)
Rx has been resent to [facility] per patient request.  I called back to advise.  They are aware.

## 2015-04-29 NOTE — Telephone Encounter (Signed)
Patient's wife is calling in regard to Rx namzaric 28-10 mg.  She states that this Rx was sent to the wrong  Pharmacy and should be sent to [facility] for a 90 day supply.  Thanks.

## 2015-04-29 NOTE — Telephone Encounter (Signed)
error 

## 2015-04-29 NOTE — Telephone Encounter (Signed)
Spoke to the pts wife on the phone and informed her that Dr. Leta Baptist had called in the prescription yesterday. She told me she did not see it, I informed her that if it was not there by midday to call me back and I would fix it for her. She thanked me

## 2015-09-19 ENCOUNTER — Encounter: Payer: Self-pay | Admitting: Cardiology

## 2015-09-19 ENCOUNTER — Ambulatory Visit (INDEPENDENT_AMBULATORY_CARE_PROVIDER_SITE_OTHER): Payer: Medicare Other | Admitting: Cardiology

## 2015-09-19 ENCOUNTER — Encounter: Payer: Self-pay | Admitting: *Deleted

## 2015-09-19 VITALS — BP 104/70 | HR 57 | Ht 69.0 in | Wt 154.0 lb

## 2015-09-19 DIAGNOSIS — Q676 Pectus excavatum: Secondary | ICD-10-CM | POA: Diagnosis not present

## 2015-09-19 DIAGNOSIS — R413 Other amnesia: Secondary | ICD-10-CM

## 2015-09-19 DIAGNOSIS — I498 Other specified cardiac arrhythmias: Secondary | ICD-10-CM

## 2015-09-19 DIAGNOSIS — R9431 Abnormal electrocardiogram [ECG] [EKG]: Secondary | ICD-10-CM

## 2015-09-19 NOTE — Progress Notes (Signed)
Cardiology Office Note   Date:  09/19/2015   ID:  Ralph Jordan, DOB 03/16/1933, MRN [id number]  PCP:  Tawanna Solo, MD  Cardiologist:   Candee Furbish, MD   Chief Complaint  Patient presents with  . other    Patient was put under sedation at a dental office and had irregular heartbeat.  Dentist wanted him to get checked out before more procedures      History of Present Illness: Ralph Jordan is a 81 y.o. male who presents for evaluation of irregular heartbeat. During a sedation dentistry procedure with Dr. Johnnye Sima, his heart rate fluctuated quite significantly. At one point, his heart rate was as low as 30 on telemetry, very skipping, then increased to tachycardia briefly. After sedation, he was back to normal. He did not have any history of syncopal episodes.  I had seen him back on 03/23/13 for follow-up of a fatty deposit/mass seen on CT scan adjacent to the left ventricle originally discovered in 2010 with echo as well as follow-up chest x-rays showing no change. He had a previous history of renal cancer which was followed closely by Dr. Alinda Money of urology. His left ventricle had no inhibition from fatty deposit on prior echo. He did have prior coronary calcification seen on CT scan and a stress test was performed in 2010 that was low risk. He exercised for 8 minutes and 10 seconds. During stress test there was a couplet, PVC in recovery, no adverse arrhythmias. Overall low risk.  Prior EKG showed left anterior fascicular block with nonspecific ST-T wave changes. There is no significant change from prior EKG from 03/08/12.  Echo from 03/16/12 showed mild left ventricular hypertrophy, EF 65%, trace mitral regurgitation, trace tricuspid regurgitation and increased fatty tissue noted surrounding the paracardial min the parasternal long axis along the right atrial free wall/apex. There was no evidence of compression, no signature can change from prior echo. Grade 1 diastolic  dysfunction.    Past Medical History  Diagnosis Date  . Hyperlipidemia   . Left renal mass   . Hypothyroidism   . Hypercholesterolemia   . Hypothyroidism   . Hernia, inguinal, right     Past Surgical History  Procedure Laterality Date  . Partial nephrectomy      left  . Appendectomy    . Back surgery    . Prostatectomy       Current Outpatient Prescriptions  Medication Sig Dispense Refill  . CRESTOR 40 MG tablet Take 1 tablet by mouth daily.    . diazepam (VALIUM) 5 MG tablet Take 5 mg by mouth every 6 (six) hours as needed for anxiety.    Marland Kitchen levothyroxine (SYNTHROID, LEVOTHROID) 88 MCG tablet Take 1 tablet by mouth daily.    . Memantine HCl-Donepezil HCl (NAMZARIC) 28-10 MG CP24 Take 1 capsule by mouth daily. 90 capsule 4  . zolpidem (AMBIEN) 10 MG tablet Take 1 tablet by mouth at bedtime as needed for sleep.   0   No current facility-administered medications for this visit.    Allergies:   Review of patient's allergies indicates no known allergies.    Social History:  The patient  reports that he has quit smoking. He has never used smokeless tobacco. He reports that he drinks alcohol. He reports that he does not use illicit drugs.   Family History:  The patient's family history includes Aneurysm in his father; Dementia in his mother and sister.    ROS:  Please see the history  of present illness.   Otherwise, review of systems are positive for none.   All other systems are reviewed and negative.    PHYSICAL EXAM: VS:  BP 104/70 mmHg  Pulse 57  Ht 5\' 9"  (1.753 m)  Wt 154 lb (69.854 kg)  BMI 22.73 kg/m2 , BMI Body mass index is 22.73 kg/(m^2). GEN: Well nourished, well developed, in no acute distress HEENT: normal Neck: no JVD, carotid bruits, or masses Cardiac: RRR; no murmurs, rubs, or gallops,no edema , pectus excavatum noted. Respiratory:  clear to auscultation bilaterally, normal work of breathing GI: soft, nontender, nondistended, + BS MS: no deformity or  atrophy Skin: warm and dry, no rash Neuro:  Strength and sensation are intact Psych: euthymic mood, full affect   EKG:  Today 09/19/15- sinus bradycardia rate 57, left anterior fascicular block and possible inferior infarct pattern but no specific change from prior EKG in 2013. T-wave inversion was noted previously in AV are and aVL. Personally viewed.  Recent Labs: No results found for requested labs within last 365 days.    Lipid Panel No results found for: CHOL, TRIG, HDL, CHOLHDL, VLDL, LDLCALC, LDLDIRECT    Wt Readings from Last 3 Encounters:  09/19/15 154 lb (69.854 kg)  04/23/15 153 lb 9.6 oz (69.673 kg)  10/21/14 152 lb 12.8 oz (69.31 kg)      Other studies Reviewed: Additional studies/ records that were reviewed today include: Prior lab work, x-ray, EKG from office note. Review of the above records demonstrates: none   ASSESSMENT AND PLAN:  1.  Irregular heartbeat/abnormal EKG-during sedation dentistry procedure, his heart rate was quite erratic according to Dr. Johnnye Sima. His heart rate went as low as the 30s transiently, multiple PVCs, skips. His blood pressure was stable throughout the procedure. After sedation was over, he was back to normal. His EKG today is reassuring, no change from prior. He does have a history of fatty "mass "adjacent to his left ventricle which has been stable on both chest x-ray as well as prior echo in 2013. I will recheck an echocardiogram to ensure proper structure and function of his heart. He has not had any decrease in exercise tolerance. No chest pain. No syncope. Also, I will go ahead and check a 24-hour Holter monitor asking him to exercise through this. I want to make sure that it appears safe for him to undergo a subsequent procedure. Perhaps, he was experiencing a mild electrolyte disorder prior to sedation perhaps. Nonetheless, I want to have reassurance with both Holter monitor as well as echo. He and his wife agree.  I contemplated  giving him low-dose metoprolol such as 25 mg prior to procedure however if he did demonstrate some evidence of bradycardia throughout his procedure I would not want to exacerbate this.  2. Extracardiac fatty mass. Stable on prior x-rays, personally viewed. Checking echo.  3. Pectus excavatum-has been of no clinical consequence. No restriction.  4. Memory issues-has seen Dr. Leta Baptist   Current medicines are reviewed at length with the patient today.  The patient does not have concerns regarding medicines.  The following changes have been made:  no change  Labs/ tests ordered today include:   Orders Placed This Encounter  Procedures  . Holter monitor - 24 hour  . EKG 12-Lead  . Echocardiogram     Disposition:   FU with [facility] in one year.  Ralph Rumpf, MD  09/19/2015 9:12 AM    [facility]  [address], [place], [place]  [address] Phone: [phone]; Fax: [phone]

## 2015-09-19 NOTE — Patient Instructions (Signed)
Medication Instructions:  No changes today  Labwork: None today  Testing/Procedures: Your physician has requested that you have an echocardiogram. Echocardiography is a painless test that uses sound waves to create images of your heart. It provides your doctor with information about the size and shape of your heart and how well your heart's chambers and valves are working. This procedure takes approximately one hour. There are no restrictions for this procedure.  Your physician has recommended that you wear a holter monitor. Holter monitors are medical devices that record the heart's electrical activity. Doctors most often use these monitors to diagnose arrhythmias. Arrhythmias are problems with the speed or rhythm of the heartbeat. The monitor is a small, portable device. You can wear one while you do your normal daily activities. This is usually used to diagnose what is causing palpitations/syncope (passing out). 24 HOUR    Follow-Up: Your physician wants you to follow-up in: 1 year with Dr Marlou Porch. (October 2017).  You will receive a reminder letter in the mail two months in advance. If you don't receive a letter, please call our office to schedule the follow-up appointment.        If you need a refill on your cardiac medications before your next appointment, please call your pharmacy.

## 2015-09-29 ENCOUNTER — Other Ambulatory Visit: Payer: Self-pay

## 2015-09-29 ENCOUNTER — Ambulatory Visit (INDEPENDENT_AMBULATORY_CARE_PROVIDER_SITE_OTHER): Payer: Medicare Other

## 2015-09-29 ENCOUNTER — Ambulatory Visit (HOSPITAL_COMMUNITY): Payer: Medicare Other | Attending: Cardiology

## 2015-09-29 DIAGNOSIS — I351 Nonrheumatic aortic (valve) insufficiency: Secondary | ICD-10-CM | POA: Diagnosis not present

## 2015-09-29 DIAGNOSIS — R9431 Abnormal electrocardiogram [ECG] [EKG]: Secondary | ICD-10-CM | POA: Diagnosis not present

## 2015-09-29 DIAGNOSIS — I498 Other specified cardiac arrhythmias: Secondary | ICD-10-CM | POA: Diagnosis not present

## 2015-10-07 ENCOUNTER — Telehealth: Payer: Self-pay | Admitting: Cardiology

## 2015-10-07 NOTE — Telephone Encounter (Signed)
New problem   Pt's wife calling: needing to know results of pt's echo and Holter. Please advise.

## 2015-10-07 NOTE — Telephone Encounter (Signed)
Reviewed results of monitor and echo with wife.  Results sent to Dr Johnnye Sima as instructed by Dr Marlou Porch.

## 2015-10-24 ENCOUNTER — Encounter: Payer: Self-pay | Admitting: Diagnostic Neuroimaging

## 2016-04-28 ENCOUNTER — Ambulatory Visit (INDEPENDENT_AMBULATORY_CARE_PROVIDER_SITE_OTHER): Payer: Medicare Other | Admitting: Diagnostic Neuroimaging

## 2016-04-28 ENCOUNTER — Encounter: Payer: Self-pay | Admitting: Diagnostic Neuroimaging

## 2016-04-28 VITALS — BP 106/74 | HR 64

## 2016-04-28 DIAGNOSIS — F0390 Unspecified dementia without behavioral disturbance: Secondary | ICD-10-CM | POA: Diagnosis not present

## 2016-04-28 DIAGNOSIS — F03A0 Unspecified dementia, mild, without behavioral disturbance, psychotic disturbance, mood disturbance, and anxiety: Secondary | ICD-10-CM

## 2016-04-28 DIAGNOSIS — F03C0 Unspecified dementia, severe, without behavioral disturbance, psychotic disturbance, mood disturbance, and anxiety: Secondary | ICD-10-CM | POA: Insufficient documentation

## 2016-04-28 MED ORDER — MEMANTINE HCL-DONEPEZIL HCL ER 28-10 MG PO CP24: 1.0000 | ORAL_CAPSULE | Freq: Every day | ORAL | Status: DC

## 2016-04-28 NOTE — Progress Notes (Signed)
[facility]  PATIENT: Ralph Jordan DOB: [birth year]  REFERRING CLINICIAN:  HISTORY FROM: patient and wife REASON FOR VISIT: follow up   HISTORICAL  CHIEF COMPLAINT:  Chief Complaint  Patient presents with  . Mild cognitive impairment w/memory loss    rm 6, wifeBethena Roys, MMSE 19  . Follow-up    HISTORY OF PRESENT ILLNESS:   UPDATE 04/28/16: Since last visit, doing well. Stable on namzaric now. Maintaining most ADLs. Still drives short distances.   UPDATE 04/23/15: Since last visit, doing well. Overall stable. ADLs are stable.   UPDATE 10/21/14: Since last visit, memory loss overall stable, but some good and some bad days. Still exercising and staying active. Some frustration and diff with multi-step tasks. Still drives close to home (no accidents or getting lost). 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   UPDATE 04/16/14: Sinc last visit, doing well. Agitation, stress, frustration has improved. Memory is stable. Now exercising 3x per week. Plays golf 1x per week. Life overall seems to be "smoother" than previously. Takes donepezil 5mg  qhs. Also diazepam 1 tab every 2 weeks for agitation/stress.  PRIOR HPI (12/17/13): 82 year old male here for evaluation of memory problems and possible dementia. According to the patient, he developed some memory loss and word finding difficulties over the past few months. Patient's spouse has noticed this since summer 2014. Symptoms are worse with stress. Patient becomes frustrated when she is and conversation and struggling finding work. Patient's wife has to fill in when he is unable to remember words. Patient has had difficulty with home projects recently including getting confused with a satellite TV receiver and electrical outlet replacement. Patient was eventually able to complete this task but he had great difficulty with. Over the next few days patient had increasing left frontal headaches, complaining to his wife that he felt there  were "magnets in the wall" which were causing electrical disturbance in his brain. He also was paranoid that a car was driving by and following him. Having increasing anxiety as a result of these problems. Patient also having poor quality sleep with early awakening. There is strong family history of dementia in patient's mother and sister. Also family history of mood disturbance.  REVIEW OF SYSTEMS: Full 14 system review of systems performed and notable only for memory loss.   ALLERGIES: No Known Allergies  HOME MEDICATIONS: Outpatient Prescriptions Prior to Visit  Medication Sig Dispense Refill  . CRESTOR 40 MG tablet Take 1 tablet by mouth daily.    . diazepam (VALIUM) 5 MG tablet Take 5 mg by mouth every 6 (six) hours as needed for anxiety.    Marland Kitchen levothyroxine (SYNTHROID, LEVOTHROID) 88 MCG tablet Take 1 tablet by mouth daily.    . Memantine HCl-Donepezil HCl (NAMZARIC) 28-10 MG CP24 Take 1 capsule by mouth daily. 90 capsule 4  . zolpidem (AMBIEN) 10 MG tablet Take 1 tablet by mouth at bedtime as needed for sleep.   0   No facility-administered medications prior to visit.    PAST MEDICAL HISTORY: Past Medical History  Diagnosis Date  . Hyperlipidemia   . Left renal mass   . Hypothyroidism   . Hypercholesterolemia   . Hypothyroidism   . Hernia, inguinal, right     PAST SURGICAL HISTORY: Past Surgical History  Procedure Laterality Date  . Partial nephrectomy      left  . Appendectomy    . Back surgery    . Prostatectomy      FAMILY HISTORY:  Family History  Problem Relation Age of Onset  . Dementia Mother   . Aneurysm Father     brain  . Dementia Sister     SOCIAL HISTORY:  Social History   Social History  . Marital Status: Married    Spouse Name: Charlett Nose  . Number of Children: 2  . Years of Education: BS   Occupational History  . Retired    Social History Main Topics  . Smoking status: Former Research scientist (life sciences)  . Smokeless tobacco: Never Used  . Alcohol Use: Yes       Comment: 1 beer daily  . Drug Use: No  . Sexual Activity: Not on file   Other Topics Concern  . Not on file   Social History Narrative   Patient lives at home with his spouse.   Caffeine Use: 1 cup daily     PHYSICAL EXAM  Filed Vitals:   04/28/16 1531  BP: 106/74  Pulse: 64    Not recorded      There is no weight on file to calculate BMI.  MMSE - Mini Mental State Exam 04/28/2016 04/23/2015  Orientation to time 4 5  Orientation to Place 1 5  Registration 3 3  Attention/ Calculation 2 4  Recall 0 2  Language- name 2 objects 2 2  Language- repeat 1 1  Language- follow 3 step command 3 2  Language- read & follow direction 1 1  Write a sentence 1 1  Copy design 1 1  Total score 19 27     Montreal Cognitive Assessment  10/21/2014  Visuospatial/ Executive (0/5) 1  Naming (0/3) 2  Attention: Read list of digits (0/2) 2  Attention: Read list of letters (0/1) 1  Attention: Serial 7 subtraction starting at 100 (0/3) 3  Language: Repeat phrase (0/2) 1  Language : Fluency (0/1) 1  Abstraction (0/2) 0  Delayed Recall (0/5) 0  Orientation (0/6) 5  Total 16  Adjusted Score (based on education) 16     GENERAL EXAM: Patient is in no distress; well developed, nourished and groomed; neck is supple  CARDIOVASCULAR: Regular rate and rhythm, no murmurs, no carotid bruits  NEUROLOGIC: MENTAL STATUS: awake, alert, normal attention and concentration, language fluent, comprehension intact, naming intact, fund of knowledge appropriate; POSITIVE MYERSONS AND PALMOMENTAL REFLEXES. NEG SNOUT.  CRANIAL NERVE:  pupils equal and reactive to light, visual fields full to confrontation, extraocular muscles intact, no nystagmus, facial sensation and strength symmetric, hearing intact, palate elevates symmetrically, uvula midline, shoulder shrug symmetric, tongue midline. MOTOR: normal bulk and tone, full strength in the BUE, BLE SENSORY: normal and symmetric to light touch   COORDINATION: finger-nose-finger, fine finger movements normal REFLEXES: deep tendon reflexes present and symmetric GAIT/STATION: narrow based gait    DIAGNOSTIC DATA (LABS, IMAGING, TESTING) - I reviewed patient records, labs, notes, testing and imaging myself where available.  Lab Results  Component Value Date   WBC 5.5 03/08/2012   HGB 14.7 03/08/2012   HCT 43.3 03/08/2012   MCV 87.1 03/08/2012   PLT 267 03/08/2012      Component Value Date/Time   NA 139 03/08/2012 0917   K 4.2 03/08/2012 0917   CL 104 03/08/2012 0917   CO2 26 03/08/2012 0917   GLUCOSE 96 03/08/2012 0917   BUN 17 03/08/2012 0917   CREATININE 1.29 03/08/2012 0917   CALCIUM 9.6 03/08/2012 0917   GFRNONAA 52* 03/08/2012 0917   GFRAA 60* 03/08/2012 0917   No results found for: CHOL  No results found for: HGBA1C Lab Results  Component Value Date   [id number] 12/17/2013   No results found for: TSH    12/11/13 MRI brain - moderate temporal atrophy; mild-mod chronic small vessel ischemic disease    ASSESSMENT AND PLAN  82 y.o. year old male here with progressive short-term memory problems, language difficulty, anxiety and insomnia since summer 2014. Symptoms and exam are consistent for possible neurodegenerative process.   12/17/13: MMSE 27/30. MOCA 20/30.  04/16/14: MMSE 27/30 10/21/14: [place] 16/30 04/23/15: MMSE 28/30 04/28/16: MMSE 18/30  Dx: mild dementia  Mild dementia   PLAN: - continue namzaric - health habits reviewed (diet, exercise, social/brain stimulation)  Meds ordered this encounter  Medications  . Memantine HCl-Donepezil HCl (NAMZARIC) 28-10 MG CP24    Sig: Take 1 capsule by mouth daily.    Dispense:  90 capsule    Refill:  4   Return in about 1 year (around 04/28/2017).    Penni Bombard, MD [id number], [id number] PM Certified in Neurology, Neurophysiology and Neuroimaging  [facility] [address] [place], [place] [address] [phone]

## 2016-04-28 NOTE — Patient Instructions (Signed)
-   continue namzaric

## 2017-04-26 ENCOUNTER — Ambulatory Visit (INDEPENDENT_AMBULATORY_CARE_PROVIDER_SITE_OTHER): Payer: Medicare Other | Admitting: Diagnostic Neuroimaging

## 2017-04-26 ENCOUNTER — Encounter: Payer: Self-pay | Admitting: Diagnostic Neuroimaging

## 2017-04-26 VITALS — BP 108/72 | HR 60 | Ht 69.0 in | Wt 156.0 lb

## 2017-04-26 DIAGNOSIS — F0390 Unspecified dementia without behavioral disturbance: Secondary | ICD-10-CM | POA: Diagnosis not present

## 2017-04-26 DIAGNOSIS — F03B0 Unspecified dementia, moderate, without behavioral disturbance, psychotic disturbance, mood disturbance, and anxiety: Secondary | ICD-10-CM

## 2017-04-26 MED ORDER — MEMANTINE HCL-DONEPEZIL HCL ER 28-10 MG PO CP24: 1.0000 | ORAL_CAPSULE | Freq: Every day | ORAL | 4 refills | Status: DC

## 2017-04-26 NOTE — Progress Notes (Signed)
[facility]  PATIENT: Ralph Jordan DOB: 06/17/1933  REFERRING CLINICIAN:  HISTORY FROM: patient and wife REASON FOR VISIT: follow up   HISTORICAL  CHIEF COMPLAINT:  Chief Complaint  Patient presents with  . Follow-up    1 year    HISTORY OF PRESENT ILLNESS:   UPDATE 04/26/17: Since last visit, wife notes more changes. More tremors. More memory loss. More incoordination. Depression is stable on sertraline. No longer driving. No longer writing checks.   UPDATE 04/28/16: Since last visit, doing well. Stable on namzaric now. Maintaining most ADLs. Still drives short distances.   UPDATE 04/23/15: Since last visit, doing well. Overall stable. ADLs are stable.   UPDATE 10/21/14: Since last visit, memory loss overall stable, but some good and some bad days. Still exercising and staying active. Some frustration and diff with multi-step tasks. Still drives close to home (no accidents or getting lost). 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   UPDATE 04/16/14: Sinc last visit, doing well. Agitation, stress, frustration has improved. Memory is stable. Now exercising 3x per week. Plays golf 1x per week. Life overall seems to be "smoother" than previously. Takes donepezil 5mg  qhs. Also diazepam 1 tab every 2 weeks for agitation/stress.  PRIOR HPI (12/17/13): 83 year old male here for evaluation of memory problems and possible dementia. According to the patient, he developed some memory loss and word finding difficulties over the past few months. Patient's spouse has noticed this since summer 2014. Symptoms are worse with stress. Patient becomes frustrated when she is and conversation and struggling finding work. Patient's wife has to fill in when he is unable to remember words. Patient has had difficulty with home projects recently including getting confused with a satellite TV receiver and electrical outlet replacement. Patient was eventually able to complete this task but he had great  difficulty with. Over the next few days patient had increasing left frontal headaches, complaining to his wife that he felt there were "magnets in the wall" which were causing electrical disturbance in his brain. He also was paranoid that a car was driving by and following him. Having increasing anxiety as a result of these problems. Patient also having poor quality sleep with early awakening. There is strong family history of dementia in patient's mother and sister. Also family history of mood disturbance.  REVIEW OF SYSTEMS: Full 14 system review of systems performed and negative except: memory loss speech diff tremors confusion anxiety weight loss insomnia.    ALLERGIES: No Known Allergies  HOME MEDICATIONS: Outpatient Medications Prior to Visit  Medication Sig Dispense Refill  . CRESTOR 40 MG tablet Take 1 tablet by mouth daily.    . diazepam (VALIUM) 5 MG tablet Take 5 mg by mouth every 6 (six) hours as needed for anxiety.    Marland Kitchen levothyroxine (SYNTHROID, LEVOTHROID) 88 MCG tablet Take 1 tablet by mouth daily.    . Memantine HCl-Donepezil HCl (NAMZARIC) 28-10 MG CP24 Take 1 capsule by mouth daily. 90 capsule 4  . zolpidem (AMBIEN) 10 MG tablet Take 1 tablet by mouth at bedtime as needed for sleep.   0   No facility-administered medications prior to visit.     PAST MEDICAL HISTORY: Past Medical History:  Diagnosis Date  . Hernia, inguinal, right   . Hypercholesterolemia   . Hyperlipidemia   . Hypothyroidism   . Hypothyroidism   . Left renal mass     PAST SURGICAL HISTORY: Past Surgical History:  Procedure Laterality Date  . APPENDECTOMY    .  BACK SURGERY    . PARTIAL NEPHRECTOMY     left  . PROSTATECTOMY      FAMILY HISTORY: Family History  Problem Relation Age of Onset  . Dementia Mother   . Aneurysm Father        brain  . Dementia Sister     SOCIAL HISTORY:  Social History   Social History  . Marital status: Married    Spouse name: Charlett Nose  . Number of  children: 2  . Years of education: BS   Occupational History  . Retired Retired   Social History Main Topics  . Smoking status: Former Research scientist (life sciences)  . Smokeless tobacco: Never Used  . Alcohol use Yes     Comment: 1 beer daily  . Drug use: No  . Sexual activity: Not on file   Other Topics Concern  . Not on file   Social History Narrative   Patient lives at home with his spouse.   Caffeine Use: 1 cup daily     PHYSICAL EXAM  GENERAL EXAM/CONSTITUTIONAL: Vitals:  Vitals:   04/26/17 1351  BP: 108/72  Pulse: 60  Weight: 156 lb (70.8 kg)  Height: 5\' 9"  (1.753 m)     Patient is in no distress; well developed, nourished and groomed; neck is supple  CARDIOVASCULAR:  Examination of carotid arteries is normal; no carotid bruits  Regular rate and rhythm, no murmurs  Examination of peripheral vascular system by observation and palpation is normal  EYES:  Ophthalmoscopic exam of optic discs and posterior segments is normal; no papilledema or hemorrhages  MUSCULOSKELETAL:  Gait, strength, tone, movements noted in Neurologic exam below  NEUROLOGIC: MENTAL STATUS:  MMSE - [place] Exam 04/26/2017 04/28/2016 04/23/2015  Orientation to time 1 4 5   Orientation to Place 2 1 5   Registration 3 3 3   Attention/ Calculation 0 2 4  Recall 0 0 2  Language- name 2 objects 2 2 2   Language- repeat 1 1 1   Language- follow 3 step command 0 3 2  Language- read & follow direction 1 1 1   Write a sentence 1 1 1   Copy design 1 1 1   Total score 12 19 27     awake, alert, oriented to person; "[place]", NOT YEAR OR MONTH  DECR RECALL   DECR attention and concentration  DECR FLUENCY; DECR COMPREHENSION, naming intact  fund of knowledge appropriate  ABLE TO DRAW CLOCK (CIRCLE AND NUMBERS; NOT HANDS FOR 10 PAST 11)  CRANIAL NERVE:   2nd - no papilledema on fundoscopic exam  2nd, 3rd, 4th, 6th - pupils equal and reactive to light, visual fields full to confrontation,  extraocular muscles intact, no nystagmus  5th - facial sensation symmetric  7th - facial strength symmetric  8th - hearing intact  9th - palate elevates symmetrically, uvula midline  11th - shoulder shrug symmetric  12th - tongue protrusion midline  MOTOR:   normal bulk and tone, full strength in the BUE, BLE  MILD POSTURAL AND ACTION TREMOR  SENSORY:   normal and symmetric to light touch  COORDINATION:   finger-nose-finger, fine finger movements normal  REFLEXES:   deep tendon reflexes present and symmetric  GAIT/STATION:   narrow based gait      DIAGNOSTIC DATA (LABS, IMAGING, TESTING) - I reviewed patient records, labs, notes, testing and imaging myself where available.  Lab Results  Component Value Date   WBC 5.5 03/08/2012   HGB 14.7 03/08/2012   HCT 43.3 03/08/2012  MCV 87.1 03/08/2012   PLT 267 03/08/2012      Component Value Date/Time   NA 139 03/08/2012 0917   K 4.2 03/08/2012 0917   CL 104 03/08/2012 0917   CO2 26 03/08/2012 0917   GLUCOSE 96 03/08/2012 0917   BUN 17 03/08/2012 0917   CREATININE 1.29 03/08/2012 0917   CALCIUM 9.6 03/08/2012 0917   GFRNONAA 52 (L) 03/08/2012 0917   GFRAA 60 (L) 03/08/2012 0917   No results found for: CHOL No results found for: HGBA1C Lab Results  Component Value Date   WIOXBDZH29 924 12/17/2013   No results found for: TSH    12/11/13 MRI brain - moderate temporal atrophy; mild-mod chronic small vessel ischemic disease    ASSESSMENT AND PLAN  83 y.o. year old male here with progressive short-term memory problems, language difficulty, anxiety and insomnia since summer 2014. Symptoms and exam are consistent for neurodegenerative dementia  12/17/13: MMSE 27/30. MOCA 20/30.  04/16/14: MMSE 27/30 10/21/14: [place] 16/30 04/23/15: MMSE 28/30 04/28/16: MMSE 18/30 04/26/17: MMSE 12/30  Dx: moderate dementia (DLB vs alzheimer's) - (established problem, worsening)  Moderate dementia without behavioral  disturbance    PLAN: I spent 25 minutes of face to face time with patient. Greater than 50% of time was spent in counseling and coordination of care with patient. In summary we discussed:  - continue namzaric - health habits reviewed (diet, exercise, social/brain stimulation) - increased safety and supervision; no driving - monitor tremor and balance  Meds ordered this encounter  Medications  . Memantine HCl-Donepezil HCl (NAMZARIC) 28-10 MG CP24    Sig: Take 1 capsule by mouth daily.    Dispense:  90 capsule    Refill:  4   Return in about 1 year (around 04/26/2018).    Penni Bombard, MD 12/29/8339, 9:62 PM Certified in Neurology, Neurophysiology and Neuroimaging  [facility] [address], [place], [place] [address] [phone]

## 2017-04-28 ENCOUNTER — Ambulatory Visit: Payer: Medicare Other | Admitting: Diagnostic Neuroimaging

## 2018-04-26 ENCOUNTER — Ambulatory Visit: Payer: Medicare Other | Admitting: Diagnostic Neuroimaging

## 2018-04-26 ENCOUNTER — Encounter: Payer: Self-pay | Admitting: Diagnostic Neuroimaging

## 2018-04-26 VITALS — BP 106/65 | HR 65 | Ht 69.0 in | Wt 162.4 lb

## 2018-04-26 DIAGNOSIS — F0390 Unspecified dementia without behavioral disturbance: Secondary | ICD-10-CM | POA: Diagnosis not present

## 2018-04-26 DIAGNOSIS — F03C0 Unspecified dementia, severe, without behavioral disturbance, psychotic disturbance, mood disturbance, and anxiety: Secondary | ICD-10-CM

## 2018-04-26 MED ORDER — MEMANTINE HCL-DONEPEZIL HCL 28-10 MG PO CP24
1.0000 | ORAL_CAPSULE | Freq: Every day | ORAL | 4 refills | Status: AC
Start: 2018-04-26 — End: ?

## 2018-04-26 NOTE — Progress Notes (Signed)
[facility]  PATIENT: Ralph Jordan DOB: [birth year]  REFERRING CLINICIAN:  HISTORY FROM: patient and wife REASON FOR VISIT: follow up   HISTORICAL  CHIEF COMPLAINT:  Chief Complaint  Patient presents with  . Dementia    rm 7, wife- Charlett Nose  . Follow-up    one year    HISTORY OF PRESENT ILLNESS:   UPDATE (04/26/18, VRP): Since last visit, doing slightly worse with memory. Tolerating meds. No alleviating or aggravating factors. Wife still working. Patient mainly stays at home.   UPDATE 04/26/17: Since last visit, wife notes more changes. More tremors. More memory loss. More incoordination. Depression is stable on sertraline. No longer driving. No longer writing checks.   UPDATE 04/28/16: Since last visit, doing well. Stable on namzaric now. Maintaining most ADLs. Still drives short distances.   UPDATE 04/23/15: Since last visit, doing well. Overall stable. ADLs are stable.   UPDATE 10/21/14: Since last visit, memory loss overall stable, but some good and some bad days. Still exercising and staying active. Some frustration and diff with multi-step tasks. Still drives close to home (no accidents or getting lost). 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   UPDATE 04/16/14: Sinc last visit, doing well. Agitation, stress, frustration has improved. Memory is stable. Now exercising 3x per week. Plays golf 1x per week. Life overall seems to be "smoother" than previously. Takes donepezil 5mg  qhs. Also diazepam 1 tab every 2 weeks for agitation/stress.  PRIOR HPI (12/17/13): 84 year old male here for evaluation of memory problems and possible dementia. According to the patient, he developed some memory loss and word finding difficulties over the past few months. Patient's spouse has noticed this since summer 2014. Symptoms are worse with stress. Patient becomes frustrated when she is and conversation and struggling finding work. Patient's wife has to fill in when he is unable to  remember words. Patient has had difficulty with home projects recently including getting confused with a satellite TV receiver and electrical outlet replacement. Patient was eventually able to complete this task but he had great difficulty with. Over the next few days patient had increasing left frontal headaches, complaining to his wife that he felt there were "magnets in the wall" which were causing electrical disturbance in his brain. He also was paranoid that a car was driving by and following him. Having increasing anxiety as a result of these problems. Patient also having poor quality sleep with early awakening. There is strong family history of dementia in patient's mother and sister. Also family history of mood disturbance.  REVIEW OF SYSTEMS: Full 14 system review of systems performed and negative except: as per HPI.    ALLERGIES: No Known Allergies  HOME MEDICATIONS: Outpatient Medications Prior to Visit  Medication Sig Dispense Refill  . CRESTOR 40 MG tablet Take 1 tablet by mouth daily.    Marland Kitchen levothyroxine (SYNTHROID, LEVOTHROID) 88 MCG tablet Take 1 tablet by mouth daily.    . Melatonin 10 MG CAPS Take by mouth.    . Memantine HCl-Donepezil HCl (NAMZARIC) 28-10 MG CP24 Take 1 capsule by mouth daily. 90 capsule 4  . sertraline (ZOLOFT) 50 MG tablet 50 mg daily.    . diazepam (VALIUM) 5 MG tablet Take 5 mg by mouth every 6 (six) hours as needed for anxiety.    Marland Kitchen zolpidem (AMBIEN) 10 MG tablet Take 1 tablet by mouth at bedtime as needed for sleep.   0   No facility-administered medications prior to visit.  PAST MEDICAL HISTORY: Past Medical History:  Diagnosis Date  . Dementia   . Hernia, inguinal, right   . Hypercholesterolemia   . Hyperlipidemia   . Hypothyroidism   . Hypothyroidism   . Left renal mass     PAST SURGICAL HISTORY: Past Surgical History:  Procedure Laterality Date  . APPENDECTOMY    . BACK SURGERY    . PARTIAL NEPHRECTOMY     left  . PROSTATECTOMY       FAMILY HISTORY: Family History  Problem Relation Age of Onset  . Dementia Mother   . Aneurysm Father        brain  . Dementia Sister     SOCIAL HISTORY:  Social History   Socioeconomic History  . Marital status: Married    Spouse name: Charlett Nose  . Number of children: 2  . Years of education: BS  . Highest education level: Not on file  Occupational History  . Occupation: Retired    Fish farm manager: RETIRED  Social Needs  . Financial resource strain: Not on file  . Food insecurity:    Worry: Not on file    Inability: Not on file  . Transportation needs:    Medical: Not on file    Non-medical: Not on file  Tobacco Use  . Smoking status: Former Research scientist (life sciences)  . Smokeless tobacco: Never Used  Substance and Sexual Activity  . Alcohol use: Yes    Comment: 1 beer daily  . Drug use: No  . Sexual activity: Not on file  Lifestyle  . Physical activity:    Days per week: Not on file    Minutes per session: Not on file  . Stress: Not on file  Relationships  . Social connections:    Talks on phone: Not on file    Gets together: Not on file    Attends religious service: Not on file    Active member of club or organization: Not on file    Attends meetings of clubs or organizations: Not on file    Relationship status: Not on file  . Intimate partner violence:    Fear of current or ex partner: Not on file    Emotionally abused: Not on file    Physically abused: Not on file    Forced sexual activity: Not on file  Other Topics Concern  . Not on file  Social History Narrative   Patient lives at home with his spouse.   Caffeine Use: 1 cup daily     PHYSICAL EXAM  GENERAL EXAM/CONSTITUTIONAL: Vitals:  Vitals:   04/26/18 1405  BP: 106/65  Pulse: 65  Weight: 162 lb 6.4 oz (73.7 kg)  Height: 5\' 9"  (1.753 m)    Patient is in no distress; well developed, nourished and groomed; neck is supple  CARDIOVASCULAR:  Examination of carotid arteries is normal; no carotid  bruits  Regular rate and rhythm, no murmurs  Examination of peripheral vascular system by observation and palpation is normal  EYES:  Ophthalmoscopic exam of optic discs and posterior segments is normal; no papilledema or hemorrhages  MUSCULOSKELETAL:  Gait, strength, tone, movements noted in Neurologic exam below  NEUROLOGIC: MENTAL STATUS:  MMSE - [place] Exam 04/26/2018 04/26/2017 04/28/2016 04/23/2015  Orientation to time 0 1 4 5   Orientation to Place 2 2 1 5   Registration 3 3 3 3   Attention/ Calculation 0 0 2 4  Recall 0 0 0 2  Language- name 2 objects 0 2 2 2   Language-  repeat 0 1 1 1   Language- follow 3 step command 0 0 3 2  Language- read & follow direction 1 1 1 1   Write a sentence 0 1 1 1   Copy design 1 1 1 1   Total score 7 12 19 27     awake, alert, oriented to person; "[place]", NOT [place] attention and concentration  DECR FLUENCY; DECR COMPREHENSION, naming intact  fund of knowledge appropriate  CANNOT DRAW CLOCK  CRANIAL NERVE:   2nd - no papilledema on fundoscopic exam  2nd, 3rd, 4th, 6th - pupils equal and reactive to light, visual fields full to confrontation, extraocular muscles intact, no nystagmus  5th - facial sensation symmetric  7th - facial strength symmetric  8th - hearing intact  9th - palate elevates symmetrically, uvula midline  11th - shoulder shrug symmetric  12th - tongue protrusion midline  MOTOR:   normal bulk and tone, full strength in the BUE, BLE  MILD POSTURAL AND ACTION TREMOR  SENSORY:   normal and symmetric to light touch  COORDINATION:   finger-nose-finger, fine finger movements normal  REFLEXES:   deep tendon reflexes present and symmetric  GAIT/STATION:   narrow based gait      DIAGNOSTIC DATA (LABS, IMAGING, TESTING) - I reviewed patient records, labs, notes, testing and imaging myself where available.  Lab Results  Component Value Date   WBC 5.5  03/08/2012   HGB 14.7 03/08/2012   HCT 43.3 03/08/2012   MCV 87.1 03/08/2012   PLT 267 03/08/2012      Component Value Date/Time   NA 139 03/08/2012 0917   K 4.2 03/08/2012 0917   CL 104 03/08/2012 0917   CO2 26 03/08/2012 0917   GLUCOSE 96 03/08/2012 0917   BUN 17 03/08/2012 0917   CREATININE 1.29 03/08/2012 0917   CALCIUM 9.6 03/08/2012 0917   GFRNONAA 52 (L) 03/08/2012 0917   GFRAA 60 (L) 03/08/2012 0917   No results found for: CHOL No results found for: HGBA1C Lab Results  Component Value Date   [id number] 885 12/17/2013   No results found for: TSH    12/11/13 MRI brain - moderate temporal atrophy; mild-mod chronic small vessel ischemic disease    ASSESSMENT AND PLAN  84 y.o. year old male here with progressive short-term memory problems, language difficulty, anxiety and insomnia since summer 2014. Symptoms and exam are consistent for neurodegenerative dementia  12/17/13: MMSE 27/30. MOCA 20/30.  04/16/14: MMSE 27/30 10/21/14: [place] 16/30 04/23/15: MMSE 28/30 04/28/16: MMSE 18/30 04/26/17: MMSE 12/30 04/26/18: mmse 7/30  Dx: moderate dementia (DLB vs alzheimer's) - (established problem, worsening)  No diagnosis found.    PLAN:  - continue namzaric - health habits reviewed (diet, exercise, social/brain stimulation) - increased safety and supervision; no driving - monitor tremor and balance  Meds ordered this encounter  Medications  . Memantine HCl-Donepezil HCl (NAMZARIC) 28-10 MG CP24    Sig: Take 1 capsule by mouth daily.    Dispense:  90 capsule    Refill:  4   Return if symptoms worsen or fail to improve, for return to PCP.    Penni Bombard, MD 0/12/7739, 2:87 PM Certified in Neurology, Neurophysiology and Neuroimaging  [facility] [address], [place], [place] [address] [phone]

## 2018-10-05 ENCOUNTER — Telehealth: Payer: Self-pay | Admitting: Diagnostic Neuroimaging

## 2018-10-05 NOTE — Telephone Encounter (Signed)
Attempted to reach wife for more details. Home number has been disconnected. Called work number and spoke with wife, on [place]. She stated they are revising their living will. The lawyer needs Dr Gladstone Lighter assessment of patient's competency level to make medical,  financial, and legal decisions. She is not trying to get guardianship.  She's requesting a letter be mailed to her home. This RN advised will let Dr Leta Baptist know of her request. She verbalized understanding, appreciation.

## 2018-10-05 NOTE — Telephone Encounter (Signed)
Pts wife Charlett Nose on [place] requesting a call, needing to know if Dr. Leta Baptist will send a letter to her lawyers stating that in his opinion the pt is or is not able to speak for himself or be able to handle his own care. Please advise

## 2018-10-06 ENCOUNTER — Encounter: Payer: Self-pay | Admitting: *Deleted

## 2018-10-06 NOTE — Telephone Encounter (Signed)
Letter signed and placed in mail.

## 2019-03-30 ENCOUNTER — Other Ambulatory Visit: Payer: Self-pay | Admitting: Diagnostic Neuroimaging

## 2019-12-07 ENCOUNTER — Ambulatory Visit: Payer: Medicare Other

## 2020-03-17 ENCOUNTER — Other Ambulatory Visit: Payer: Self-pay

## 2020-03-17 ENCOUNTER — Emergency Department (HOSPITAL_COMMUNITY): Payer: Medicare Other

## 2020-03-17 ENCOUNTER — Emergency Department (HOSPITAL_COMMUNITY)
Admission: EM | Admit: 2020-03-17 | Discharge: 2020-03-17 | Disposition: A | Discharge status: Home / Self Care | Payer: Medicare Other | Attending: Emergency Medicine | Admitting: Emergency Medicine

## 2020-03-17 DIAGNOSIS — E039 Hypothyroidism, unspecified: Secondary | ICD-10-CM | POA: Insufficient documentation

## 2020-03-17 DIAGNOSIS — F0390 Unspecified dementia without behavioral disturbance: Secondary | ICD-10-CM | POA: Insufficient documentation

## 2020-03-17 DIAGNOSIS — R001 Bradycardia, unspecified: Secondary | ICD-10-CM | POA: Diagnosis not present

## 2020-03-17 DIAGNOSIS — R259 Unspecified abnormal involuntary movements: Secondary | ICD-10-CM | POA: Insufficient documentation

## 2020-03-17 DIAGNOSIS — Z79899 Other long term (current) drug therapy: Secondary | ICD-10-CM | POA: Diagnosis not present

## 2020-03-17 DIAGNOSIS — Z87891 Personal history of nicotine dependence: Secondary | ICD-10-CM | POA: Diagnosis not present

## 2020-03-17 MED ORDER — SODIUM CHLORIDE 0.9% FLUSH: 3.0000 mL | Freq: Once | INTRAVENOUS | Status: DC

## 2020-03-17 NOTE — Discharge Instructions (Signed)
Please call your family doctor tomorrow and let them know how he is doing.  Please return to the ED at anytime you would like a further evaluation of this episode that he had.

## 2020-03-17 NOTE — ED Triage Notes (Signed)
Patient arrived via EMS; reported wife witnessed patient to be dragging right leg while ambulating in the hallway. EMS endorsed this symptom was resolved when unit arrived. Wife reported hx of TIA and dementia; patient is non verbal and unable to follow commands. EMS stated EKG appears normal and wife denies prior cardiac problem.

## 2020-03-17 NOTE — ED Provider Notes (Signed)
[facility] EMERGENCY DEPARTMENT Provider Note   CSN: [id number] Arrival date & time: 03/17/20  1757     History Chief Complaint  Patient presents with  . Abnormal ECG    SUNDEEP KOSKO is a 86 y.o. male.  86 yo M with chief complaints of having his legs buckle.  His wife states that he was holding against the wall and had his legs partially bent and internally rotated at the hips.  She helped him to a seated position.  She had a calling 911.  By the time they arrived he was back to his baseline.  Was able to ambulate without issue.  No apparent areas of pain per the family.  EMS was concerned about his heart rate and so the family brought him here for evaluation.  Patient is severely demented and is nonverbal during my exam.  Level 5 caveat  The history is provided by the patient and the spouse.  Illness Associated symptoms: no abdominal pain, no chest pain, no congestion, no diarrhea, no fever, no headaches, no myalgias, no rash, no shortness of breath and no vomiting        Past Medical History:  Diagnosis Date  . Dementia   . Hernia, inguinal, right   . Hypercholesterolemia   . Hyperlipidemia   . Hypothyroidism   . Hypothyroidism   . Left renal mass     Patient Active Problem List   Diagnosis Date Noted  . Severe dementia ([place]) 04/28/2016    Past Surgical History:  Procedure Laterality Date  . APPENDECTOMY    . BACK SURGERY    . PARTIAL NEPHRECTOMY     left  . PROSTATECTOMY         Family History  Problem Relation Age of Onset  . Dementia Mother   . Aneurysm Father        brain  . Dementia Sister     Social History   Tobacco Use  . Smoking status: Former Research scientist (life sciences)  . Smokeless tobacco: Never Used  Substance Use Topics  . Alcohol use: Yes    Comment: 1 beer daily  . Drug use: No    Home Medications Prior to Admission medications   Medication Sig Start Date End Date Taking? Authorizing Provider  CRESTOR 40 MG tablet Take 1  tablet by mouth daily. 12/05/13   [provider]  levothyroxine (SYNTHROID, LEVOTHROID) 88 MCG tablet Take 1 tablet by mouth daily. 02/27/15   [provider]  Melatonin 10 MG CAPS Take by mouth.    [provider]  Memantine HCl-Donepezil HCl (NAMZARIC) 28-10 MG CP24 Take 1 capsule by mouth daily. 04/26/18   Penumalli, Earlean Polka, MD  sertraline (ZOLOFT) 50 MG tablet 50 mg daily. 02/13/18   [provider]    Allergies    Patient has no known allergies.  Review of Systems   Review of Systems  Constitutional: Negative for chills and fever.  HENT: Negative for congestion and facial swelling.   Eyes: Negative for discharge and visual disturbance.  Respiratory: Negative for shortness of breath.   Cardiovascular: Negative for chest pain and palpitations.  Gastrointestinal: Negative for abdominal pain, diarrhea and vomiting.  Musculoskeletal: Negative for arthralgias and myalgias.  Skin: Negative for color change and rash.  Neurological: Negative for tremors, syncope and headaches.  Psychiatric/Behavioral: Negative for confusion and dysphoric mood.    Physical Exam Updated Vital Signs BP 134/68 (BP Location: Left Arm)   Pulse (!) 54   Temp  98 F (36.7 C) (Oral)   Resp 16   Ht 5\' 9"  (1.753 m)   Wt 63.5 kg   SpO2 100%   BMI 20.67 kg/m   Physical Exam Vitals and nursing note reviewed.  Constitutional:      Appearance: He is well-developed.  HENT:     Head: Normocephalic and atraumatic.  Eyes:     Pupils: Pupils are equal, round, and reactive to light.  Neck:     Vascular: No JVD.  Cardiovascular:     Rate and Rhythm: Normal rate and regular rhythm.     Heart sounds: No murmur. No friction rub. No gallop.   Pulmonary:     Effort: No respiratory distress.     Breath sounds: No wheezing.  Abdominal:     General: There is no distension.     Tenderness: There is no guarding or rebound.  Musculoskeletal:        General: Normal range of motion.       Cervical back: Normal range of motion and neck supple.  Skin:    Coloration: Skin is not pale.     Findings: No rash.  Neurological:     Mental Status: He is alert.     Comments: Alert moving all 4 extremities spontaneously     ED Results / Procedures / Treatments   Labs (all labs ordered are listed, but only abnormal results are displayed) Labs Reviewed - No data to display  EKG None  Radiology DG Chest 2 View  Result Date: 03/17/2020 CLINICAL DATA:  Abnormal EKG EXAM: CHEST - 2 VIEW COMPARISON:  03/04/2017 FINDINGS: The heart size and mediastinal contours are within normal limits. Both lungs are clear. Pectus deformity of the lower anterior chest wall. IMPRESSION: No active cardiopulmonary disease. Electronically Signed   By: Donavan Foil M.D.   On: 03/17/2020 18:49    Procedures Procedures (including critical care time)  Medications Ordered in ED Medications - No data to display  ED Course  I have reviewed the triage vital signs and the nursing notes.  Pertinent labs & imaging results that were available during my care of the patient were reviewed by me and considered in my medical decision making (see chart for details).    MDM Rules/Calculators/A&P                      86 yo M with a chief complaint of an episode where his legs appear to be weak per the family.  This resolved spontaneously.  Unfortunately he has difficulty communicating due to severe dementia.  The wife brought him in here because the EMS provider told him that his EKG showed bradycardia and he was not sure if this is new for him so recommended they get this evaluated in the ED.  Patient is bradycardic here.  Does not appear to be symptomatic from it.  Looking at his old vital signs he typically runs in the 29s or 25s.  I discussed this with the family.  He had a chest x-ray viewed by me without focal infiltrate or pneumothorax.  They would like no further work-up at this time.  I did offer to  evaluate electrolytes and observe him in the emergency department.  They would prefer to take him home.  To follow-up with her family doctor.  7:34 PM:  I have discussed the diagnosis/risks/treatment options with the patient and believe the pt to be eligible for discharge home to follow-up with PCP. We  also discussed returning to the ED immediately if new or worsening sx occur. We discussed the sx which are most concerning (e.g., sudden worsening pain, fever, inability to tolerate by mouth) that necessitate immediate return. Medications administered to the patient during their visit and any new prescriptions provided to the patient are listed below.  Medications given during this visit Medications - No data to display   The patient appears reasonably screen and/or stabilized for discharge and I doubt any other medical condition or other [facility] requiring further screening, evaluation, or treatment in the ED at this time prior to discharge.   Final Clinical Impression(s) / ED Diagnoses Final diagnoses:  Abnormal motor activity  Sinus bradycardia    Rx / DC Orders ED Discharge Orders    None       Deno Etienne, DO 03/17/20 1934

## 2020-03-17 NOTE — ED Notes (Signed)
Pt able to stand and walk to the wheelchair with standby assist. Pt's wife verbalized understanding of discharge instructions and follow-up care.

## 2020-06-17 ENCOUNTER — Encounter: Payer: Self-pay | Admitting: Diagnostic Neuroimaging

## 2020-06-17 ENCOUNTER — Ambulatory Visit: Payer: Medicare Other | Admitting: Diagnostic Neuroimaging

## 2020-06-17 ENCOUNTER — Telehealth: Payer: Self-pay | Admitting: *Deleted

## 2020-06-17 ENCOUNTER — Other Ambulatory Visit: Payer: Self-pay

## 2020-06-17 VITALS — BP 95/60 | HR 54 | Ht 69.0 in | Wt 141.8 lb

## 2020-06-17 DIAGNOSIS — F03C0 Unspecified dementia, severe, without behavioral disturbance, psychotic disturbance, mood disturbance, and anxiety: Secondary | ICD-10-CM

## 2020-06-17 DIAGNOSIS — F0390 Unspecified dementia without behavioral disturbance: Secondary | ICD-10-CM | POA: Diagnosis not present

## 2020-06-17 MED ORDER — DEXTROMETHORPHAN-QUINIDINE 20-10 MG PO CAPS: 1.0000 | ORAL_CAPSULE | Freq: Two times a day (BID) | ORAL | 6 refills | Status: AC

## 2020-06-17 NOTE — Telephone Encounter (Signed)
Nudexta PA, key: [id number], failed Seroquel.  Your information has been sent to [facility].

## 2020-06-17 NOTE — Patient Instructions (Addendum)
-   nuedexta 1 tab daily x 1 week; then 1 tab twice a day

## 2020-06-17 NOTE — Progress Notes (Signed)
[facility]  PATIENT: Ralph Jordan DOB: 1933-11-16  REFERRING CLINICIAN:  HISTORY FROM: patient and wife REASON FOR VISIT: follow up   HISTORICAL  CHIEF COMPLAINT:  Chief Complaint  Patient presents with  . Dementia    rm 6, FU req by wife, Charlett Nose due to aggression/becomes very physical, Seroquel not tolerated"      HISTORY OF PRESENT ILLNESS:   UPDATE (06/17/20, VRP): Since last visit, doing 06/17/20. Symptoms are progressive. Main difficulty is with weekly bathing / hygiene. Severity is moderate-severe. No alleviating or aggravating factors.  UPDATE (04/26/18, VRP): Since last visit, doing slightly worse with memory. Tolerating meds. No alleviating or aggravating factors. Wife still working. Patient mainly stays at home.   UPDATE 04/26/17: Since last visit, wife notes more changes. More tremors. More memory loss. More incoordination. Depression is stable on sertraline. No longer driving. No longer writing checks.   UPDATE 04/28/16: Since last visit, doing well. Stable on namzaric now. Maintaining most ADLs. Still drives short distances.   UPDATE 04/23/15: Since last visit, doing well. Overall stable. ADLs are stable.   UPDATE 10/21/14: Since last visit, memory loss overall stable, but some good and some bad days. Still exercising and staying active. Some frustration and diff with multi-step tasks. Still drives close to home (no accidents or getting lost). 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   UPDATE 04/16/14: Sinc last visit, doing well. Agitation, stress, frustration has improved. Memory is stable. Now exercising 3x per week. Plays golf 1x per week. Life overall seems to be "smoother" than previously. Takes donepezil 5mg  qhs. Also diazepam 1 tab every 2 weeks for agitation/stress.  PRIOR HPI (12/17/13): 86 year old male here for evaluation of memory problems and possible dementia. According to the patient, he developed some memory loss and word finding difficulties  over the past few months. Patient's spouse has noticed this since summer 2014. Symptoms are worse with stress. Patient becomes frustrated when she is and conversation and struggling finding work. Patient's wife has to fill in when he is unable to remember words. Patient has had difficulty with home projects recently including getting confused with a satellite TV receiver and electrical outlet replacement. Patient was eventually able to complete this task but he had great difficulty with. Over the next few days patient had increasing left frontal headaches, complaining to his wife that he felt there were "magnets in the wall" which were causing electrical disturbance in his brain. He also was paranoid that a car was driving by and following him. Having increasing anxiety as a result of these problems. Patient also having poor quality sleep with early awakening. There is strong family history of dementia in patient's mother and sister. Also family history of mood disturbance.   REVIEW OF SYSTEMS: Full 14 system review of systems performed and negative except: as per HPI.    ALLERGIES: Allergies  Allergen Reactions  . Seroquel [Quetiapine]     Ineffective, sedating    HOME MEDICATIONS: Outpatient Medications Prior to Visit  Medication Sig Dispense Refill  . CRESTOR 40 MG tablet Take 1 tablet by mouth daily.    Marland Kitchen donepezil (ARICEPT) 10 MG tablet Take 10 mg by mouth daily.    Marland Kitchen levothyroxine (SYNTHROID) 75 MCG tablet Take 75 mcg by mouth daily.    . Melatonin 10 MG CAPS Take by mouth.    . memantine (NAMENDA) 10 MG tablet Take 10 mg by mouth 2 (two) times daily.    . Memantine HCl-Donepezil HCl (  NAMZARIC) 28-10 MG CP24 Take 1 capsule by mouth daily. 90 capsule 4  . sertraline (ZOLOFT) 50 MG tablet 50 mg daily.    . Vitamin D, Ergocalciferol, (DRISDOL) 1.25 MG (50000 UNIT) CAPS capsule 1 capsule. Once every two weeks    . levothyroxine (SYNTHROID, LEVOTHROID) 88 MCG tablet Take 1 tablet by mouth  daily.     No facility-administered medications prior to visit.    PAST MEDICAL HISTORY: Past Medical History:  Diagnosis Date  . Dementia ([place])   . Hernia, inguinal, right   . Hypercholesterolemia   . Hyperlipidemia   . Hypothyroidism   . Left renal mass     PAST SURGICAL HISTORY: Past Surgical History:  Procedure Laterality Date  . APPENDECTOMY    . BACK SURGERY    . PARTIAL NEPHRECTOMY     left  . PROSTATECTOMY      FAMILY HISTORY: Family History  Problem Relation Age of Onset  . Dementia Mother   . Aneurysm Father        brain  . Dementia Sister     SOCIAL HISTORY:  Social History   Socioeconomic History  . Marital status: Married    Spouse name: Charlett Nose  . Number of children: 2  . Years of education: BS  . Highest education level: Not on file  Occupational History  . Occupation: Retired    Fish farm manager: RETIRED  Tobacco Use  . Smoking status: Former Research scientist (life sciences)  . Smokeless tobacco: Never Used  Substance and Sexual Activity  . Alcohol use: Not Currently  . Drug use: No  . Sexual activity: Not on file  Other Topics Concern  . Not on file  Social History Narrative   06/17/20 Patient lives at home with his spouse. A caregiver comes in 6 hrs daily, Mon-Fri   Caffeine Use: 1 cup daily   Social Determinants of Health   Financial Resource Strain:   . Difficulty of Paying Living Expenses:   Food Insecurity:   . Worried About Charity fundraiser in the Last Year:   . Arboriculturist in the Last Year:   Transportation Needs:   . Film/video editor (Medical):   Marland Kitchen Lack of Transportation (Non-Medical):   Physical Activity:   . Days of Exercise per Week:   . Minutes of Exercise per Session:   Stress:   . Feeling of Stress :   Social Connections:   . Frequency of Communication with Friends and Family:   . Frequency of Social Gatherings with Friends and Family:   . Attends Religious Services:   . Active Member of Clubs or Organizations:   . Attends English as a second language teacher Meetings:   Marland Kitchen Marital Status:   Intimate Partner Violence:   . Fear of Current or Ex-Partner:   . Emotionally Abused:   Marland Kitchen Physically Abused:   . Sexually Abused:      PHYSICAL EXAM  GENERAL EXAM/CONSTITUTIONAL: Vitals:  Vitals:   06/17/20 0836  BP: (!) 95/60  Pulse: 54  Weight: 141 lb 12.8 oz (64.3 kg)  Height: 5\' 9"  (1.753 m)    Patient is in no distress; well developed, nourished and groomed; neck is supple  CARDIOVASCULAR:  Examination of carotid arteries is normal; no carotid bruits  Regular rate and rhythm, no murmurs  Examination of peripheral vascular system by observation and palpation is normal  EYES:  Ophthalmoscopic exam of optic discs and posterior segments is normal; no papilledema or hemorrhages  MUSCULOSKELETAL:  Gait, strength, tone,  movements noted in Neurologic exam below  NEUROLOGIC: MENTAL STATUS:  MMSE - Mini Mental State Exam 04/26/2018 04/26/2017 04/28/2016 04/23/2015  Orientation to time 0 1 4 5   Orientation to Place 2 2 1 5   Registration 3 3 3 3   Attention/ Calculation 0 0 2 4  Recall 0 0 0 2  Language- name 2 objects 0 2 2 2   Language- repeat 0 1 1 1   Language- follow 3 step command 0 0 3 2  Language- read & follow direction 1 1 1 1   Write a sentence 0 1 1 1   Copy design 1 1 1 1   Total score 7 12 19 27     awake, alert, oriented to person; "[place]", NOT [place] attention and concentration  DECR FLUENCY; DECR COMPREHENSION, naming intact  fund of knowledge appropriate  CANNOT DRAW CLOCK  CRANIAL NERVE:   2nd - no papilledema on fundoscopic exam  2nd, 3rd, 4th, 6th - pupils equal and reactive to light, visual fields full to confrontation, extraocular muscles intact, no nystagmus  5th - facial sensation symmetric  7th - facial strength symmetric  8th - hearing intact  9th - palate elevates symmetrically, uvula midline  11th - shoulder shrug symmetric  12th - tongue  protrusion midline  MOTOR:   normal bulk and tone, full strength in the BUE, BLE  MILD POSTURAL AND ACTION TREMOR  SENSORY:   normal and symmetric to light touch  COORDINATION:   finger-nose-finger, fine finger movements normal  REFLEXES:   deep tendon reflexes present and symmetric  GAIT/STATION:   narrow based gait      DIAGNOSTIC DATA (LABS, IMAGING, TESTING) - I reviewed patient records, labs, notes, testing and imaging myself where available.  Lab Results  Component Value Date   WBC 5.5 03/08/2012   HGB 14.7 03/08/2012   HCT 43.3 03/08/2012   MCV 87.1 03/08/2012   PLT 267 03/08/2012      Component Value Date/Time   NA 139 03/08/2012 0917   K 4.2 03/08/2012 0917   CL 104 03/08/2012 0917   CO2 26 03/08/2012 0917   GLUCOSE 96 03/08/2012 0917   BUN 17 03/08/2012 0917   CREATININE 1.29 03/08/2012 0917   CALCIUM 9.6 03/08/2012 0917   GFRNONAA 52 (L) 03/08/2012 0917   GFRAA 60 (L) 03/08/2012 0917   No results found for: CHOL No results found for: HGBA1C Lab Results  Component Value Date   PXTGGYIR48 546 12/17/2013   No results found for: TSH    12/11/13 MRI brain - moderate temporal atrophy; mild-mod chronic small vessel ischemic disease    ASSESSMENT AND PLAN  86 y.o. year old male here with progressive short-term memory problems, language difficulty, anxiety and insomnia since summer 2014. Symptoms and exam are consistent for neurodegenerative dementia  12/17/13: MMSE 27/30. MOCA 20/30.  04/16/14: MMSE 27/30 10/21/14: Ferdinand 16/30 04/23/15: MMSE 28/30 04/28/16: MMSE 18/30 04/26/17: MMSE 12/30 04/26/18: mmse 7/30  Dx: moderate dementia (DLB vs alzheimer's) - (established problem, worsening)  Severe dementia ([place])    PLAN:  SEVERE DEMENTIA WITH BEHAVIOR CHANGES - continue memantine + donepezil; may consider to stop if patient cannot cooperate to take meds - trial nuedexta 1 tab daily x 1 week; then 1 tab twice a day   Meds ordered this  encounter  Medications  . Dextromethorphan-quiNIDine (NUEDEXTA) 20-10 MG capsule    Sig: Take 1 capsule by mouth 2 (two) times daily.    Dispense:  60 capsule    Refill:  6   Return for pending if symptoms worsen or fail to improve.    Penni Bombard, MD 0/92/9574, 7:34 AM Certified in Neurology, Neurophysiology and Neuroimaging  [facility] [address], [place], [place] [address] [phone]

## 2020-06-17 NOTE — Telephone Encounter (Signed)
Medicare has denied Nudexta. It is not FDA approved for severe dementia with behavior changes.

## 2020-06-18 NOTE — Telephone Encounter (Signed)
Dx: pseudobulbar affect.  -VRP

## 2020-06-18 NOTE — Telephone Encounter (Addendum)
Called [facility] to do expedited appeal, was transferred numerous times, on phone > 25 minutes. Ended the call, will attempt appeal by fax. Letter of appeal on MD's desk for review, signature.

## 2020-06-19 NOTE — Telephone Encounter (Signed)
Nuedexta appeal letter and copy of denial faxed to [employer] appeals and grievances Dept, f [phone]. Notified patient via my chart.

## 2020-06-23 NOTE — Telephone Encounter (Signed)
Received fax from [facility] with additional clinical questions. Questions answered, form faxed back to [facility].

## 2020-07-02 NOTE — Telephone Encounter (Signed)
Called [facility] [phone], spoke with Langley Gauss who called [facility] and was told Nudexta was denied. Thanked her for her assistance.

## 2020-07-02 NOTE — Telephone Encounter (Addendum)
Called [employer] [facility] to check status of additional questions for nuedexta PA faxed on 06/23/20. Caryl Pina stated I needed to call [facility], [phone]. Connected but was told wrong #. Called [employer], Medicare Rx provider line, [phone]. Ref # for call [id number]. Spoke with Merrilee Seashore, who stated the system is updating; I have to call back.

## 2020-07-03 ENCOUNTER — Encounter: Payer: Self-pay | Admitting: *Deleted

## 2020-07-03 NOTE — Telephone Encounter (Signed)
Called wife and advised her of Dr Clydene Fake message. She stated that it will be fine to wait until Monday for Dr Leta Baptist to address. She verbalized understanding, appreciation.'

## 2020-07-03 NOTE — Telephone Encounter (Signed)
Looks like this is not an acute issue and can wait till Dr. Leta Baptist returns next Monday unless the family feels he needs something to be done right now

## 2020-07-09 MED ORDER — LORAZEPAM 0.5 MG PO TABS: 0.5000 mg | ORAL_TABLET | Freq: Every day | ORAL | 0 refills | Status: AC | PRN

## 2020-07-09 NOTE — Addendum Note (Signed)
Addended by: Andrey Spearman R on: 07/09/2020 04:58 PM   Modules accepted: Orders

## 2020-07-09 NOTE — Telephone Encounter (Signed)
Meds ordered this encounter  Medications  . LORazepam (ATIVAN) 0.5 MG tablet    Sig: Take 1 tablet (0.5 mg total) by mouth daily as needed for anxiety or sedation (as needed before personal care; max 10 per month).    Dispense:  10 tablet    Refill:  0   Caution with ativan in the elderly.    Penni Bombard, MD 12/08/4351, 9:12 PM Certified in Neurology, Neurophysiology and Neuroimaging  [facility] [address], [place], [place] [address] [phone]

## 2020-07-10 NOTE — Telephone Encounter (Signed)
Spoke with wife and advised her of new Rx. We discussed in detail the precautions to take, possible side effects. She verbalized understanding, appreciation.

## 2021-01-14 DIAGNOSIS — E559 Vitamin D deficiency, unspecified: Secondary | ICD-10-CM | POA: Diagnosis not present

## 2021-01-14 DIAGNOSIS — N183 Chronic kidney disease, stage 3 unspecified: Secondary | ICD-10-CM | POA: Diagnosis not present

## 2021-01-14 DIAGNOSIS — E039 Hypothyroidism, unspecified: Secondary | ICD-10-CM | POA: Diagnosis not present

## 2021-01-14 DIAGNOSIS — E7800 Pure hypercholesterolemia, unspecified: Secondary | ICD-10-CM | POA: Diagnosis not present

## 2021-01-14 DIAGNOSIS — G4700 Insomnia, unspecified: Secondary | ICD-10-CM | POA: Diagnosis not present

## 2021-01-22 DIAGNOSIS — E559 Vitamin D deficiency, unspecified: Secondary | ICD-10-CM | POA: Diagnosis not present

## 2021-01-22 DIAGNOSIS — E7800 Pure hypercholesterolemia, unspecified: Secondary | ICD-10-CM | POA: Diagnosis not present

## 2021-01-22 DIAGNOSIS — Z79899 Other long term (current) drug therapy: Secondary | ICD-10-CM | POA: Diagnosis not present

## 2021-01-22 DIAGNOSIS — E039 Hypothyroidism, unspecified: Secondary | ICD-10-CM | POA: Diagnosis not present

## 2021-01-28 DIAGNOSIS — E559 Vitamin D deficiency, unspecified: Secondary | ICD-10-CM | POA: Diagnosis not present

## 2021-01-28 DIAGNOSIS — E7800 Pure hypercholesterolemia, unspecified: Secondary | ICD-10-CM | POA: Diagnosis not present

## 2021-01-28 DIAGNOSIS — E039 Hypothyroidism, unspecified: Secondary | ICD-10-CM | POA: Diagnosis not present

## 2021-02-03 ENCOUNTER — Emergency Department (HOSPITAL_COMMUNITY)
Admission: EM | Admit: 2021-02-03 | Discharge: 2021-02-03 | Disposition: A | Discharge status: Home / Self Care | Payer: Medicare Other | Attending: Emergency Medicine | Admitting: Emergency Medicine

## 2021-02-03 ENCOUNTER — Emergency Department (HOSPITAL_COMMUNITY): Payer: Medicare Other

## 2021-02-03 ENCOUNTER — Other Ambulatory Visit: Payer: Self-pay

## 2021-02-03 DIAGNOSIS — Z87891 Personal history of nicotine dependence: Secondary | ICD-10-CM | POA: Insufficient documentation

## 2021-02-03 DIAGNOSIS — E039 Hypothyroidism, unspecified: Secondary | ICD-10-CM | POA: Insufficient documentation

## 2021-02-03 DIAGNOSIS — Z7401 Bed confinement status: Secondary | ICD-10-CM | POA: Diagnosis not present

## 2021-02-03 DIAGNOSIS — Z79899 Other long term (current) drug therapy: Secondary | ICD-10-CM | POA: Diagnosis not present

## 2021-02-03 DIAGNOSIS — F0390 Unspecified dementia without behavioral disturbance: Secondary | ICD-10-CM | POA: Insufficient documentation

## 2021-02-03 DIAGNOSIS — W19XXXA Unspecified fall, initial encounter: Secondary | ICD-10-CM | POA: Insufficient documentation

## 2021-02-03 DIAGNOSIS — R404 Transient alteration of awareness: Secondary | ICD-10-CM | POA: Diagnosis not present

## 2021-02-03 DIAGNOSIS — Z743 Need for continuous supervision: Secondary | ICD-10-CM | POA: Diagnosis not present

## 2021-02-03 DIAGNOSIS — M2550 Pain in unspecified joint: Secondary | ICD-10-CM | POA: Diagnosis not present

## 2021-02-03 DIAGNOSIS — S0990XA Unspecified injury of head, initial encounter: Secondary | ICD-10-CM | POA: Diagnosis not present

## 2021-02-03 DIAGNOSIS — R5381 Other malaise: Secondary | ICD-10-CM | POA: Diagnosis not present

## 2021-02-03 DIAGNOSIS — Z043 Encounter for examination and observation following other accident: Secondary | ICD-10-CM | POA: Diagnosis not present

## 2021-02-03 NOTE — ED Notes (Signed)
Patient is resting comfortably. Bed alarm on. Bed in trendelenburg and waist restraint in place. Patient is asleep.

## 2021-02-03 NOTE — ED Notes (Signed)
Attempted to call report to [facility]. No answer.  [facility] will transport. Pt remain alert with no signs of distress.

## 2021-02-03 NOTE — ED Notes (Signed)
Request made for transport by [facility]

## 2021-02-03 NOTE — ED Notes (Signed)
Pt is unable to answer questions. No apparent distress. No contusions noted on his head. No bruises on extremities, abdomen, or back.

## 2021-02-03 NOTE — ED Notes (Signed)
Waist restraint applied to pt after her repeatedly attempted to get despite bed alarm and bed in trendelenburg

## 2021-02-03 NOTE — ED Triage Notes (Signed)
Pt had unwitnessed mechanical fall. Pt denies hitting his head.  Pt was ambulatory when EMS arrived. Facility reports pt's behavior is normal per baseline. Hx of dementia.

## 2021-02-03 NOTE — ED Provider Notes (Signed)
[facility] DEPT Provider Note   CSN: [id number] Arrival date & time: 02/03/21  0019     History Chief Complaint  Patient presents with  . Fall    Ralph Jordan is a 87 y.o. male.  Patient sent to the emergency department from skilled nursing facility for evaluation after an unwitnessed fall.  Patient does have a history of dementia, is reportedly at his neurologic baseline after the fall.  Patient does not remember the fall, cannot answer the question because of his chronic dementia.  Level 5 caveat due to dementia.  He does not have any complaints at arrival.        Past Medical History:  Diagnosis Date  . Dementia ([place])   . Hernia, inguinal, right   . Hypercholesterolemia   . Hyperlipidemia   . Hypothyroidism   . Left renal mass     Patient Active Problem List   Diagnosis Date Noted  . Severe dementia ([place]) 04/28/2016    Past Surgical History:  Procedure Laterality Date  . APPENDECTOMY    . BACK SURGERY    . PARTIAL NEPHRECTOMY     left  . PROSTATECTOMY         Family History  Problem Relation Age of Onset  . Dementia Mother   . Aneurysm Father        brain  . Dementia Sister     Social History   Tobacco Use  . Smoking status: Former Research scientist (life sciences)  . Smokeless tobacco: Never Used  Substance Use Topics  . Alcohol use: Not Currently  . Drug use: No    Home Medications Prior to Admission medications   Medication Sig Start Date End Date Taking? Authorizing Provider  ALPRAZolam Duanne Moron) 1 MG tablet Take 1 mg by mouth every 6 (six) hours as needed for anxiety.   Yes [provider]  levothyroxine (SYNTHROID) 75 MCG tablet Take 75 mcg by mouth daily. 05/01/20  Yes [provider]  memantine (NAMENDA) 10 MG tablet Take 10 mg by mouth at bedtime. 05/22/20  Yes [provider]  OLANZapine (ZYPREXA) 5 MG tablet Take 5 mg by mouth 2 (two) times daily.   Yes [provider]  sertraline (ZOLOFT) 25  MG tablet Take 25 mg by mouth daily. 02/13/18  Yes [provider]  Vitamin D, Ergocalciferol, (DRISDOL) 1.25 MG (50000 UNIT) CAPS capsule 1 capsule. Once every two weeks 03/04/20  Yes [provider]  CRESTOR 40 MG tablet Take 1 tablet by mouth daily. 12/05/13   [provider]  Dextromethorphan-quiNIDine (NUEDEXTA) 20-10 MG capsule Take 1 capsule by mouth 2 (two) times daily. Patient not taking: Reported on 02/03/2021 06/17/20   Penumalli, Earlean Polka, MD  donepezil (ARICEPT) 10 MG tablet Take 10 mg by mouth daily. Patient not taking: Reported on 02/03/2021 05/22/20   [provider]  LORazepam (ATIVAN) 0.5 MG tablet Take 1 tablet (0.5 mg total) by mouth daily as needed for anxiety or sedation (as needed before personal care; max 10 per month). Patient not taking: Reported on 02/03/2021 07/09/20   Penumalli, Earlean Polka, MD  melatonin 5 MG TABS Take 1 tablet by mouth at bedtime.    [provider]  Memantine HCl-Donepezil HCl (NAMZARIC) 28-10 MG CP24 Take 1 capsule by mouth daily. Patient not taking: Reported on 02/03/2021 04/26/18   Penni Bombard, MD    Allergies    Seroquel [quetiapine] and Other  Review of Systems   Review of Systems  Unable  to perform ROS: Dementia    Physical Exam Updated Vital Signs BP 125/69   Pulse (!) 48   Temp 97.7 F (36.5 C)   Resp 18   SpO2 99%   Physical Exam Vitals and nursing note reviewed.  Constitutional:      General: He is not in acute distress.    Appearance: Normal appearance. He is well-developed.  HENT:     Head: Normocephalic and atraumatic.     Right Ear: Hearing normal.     Left Ear: Hearing normal.     Nose: Nose normal.  Eyes:     Conjunctiva/sclera: Conjunctivae normal.     Pupils: Pupils are equal, round, and reactive to light.  Cardiovascular:     Rate and Rhythm: Regular rhythm.     Heart sounds: S1 normal and S2 normal. No murmur heard. No friction rub. No gallop.   Pulmonary:      Effort: Pulmonary effort is normal. No respiratory distress.     Breath sounds: Normal breath sounds.  Chest:     Chest wall: No tenderness.  Abdominal:     General: Bowel sounds are normal.     Palpations: Abdomen is soft.     Tenderness: There is no abdominal tenderness. There is no guarding or rebound. Negative signs include Murphy's sign and McBurney's sign.     Hernia: No hernia is present.  Musculoskeletal:        General: Normal range of motion.     Cervical back: Normal range of motion and neck supple.  Skin:    General: Skin is warm and dry.     Findings: No rash.  Neurological:     General: No focal deficit present.     Mental Status: He is alert. Mental status is at baseline.     Cranial Nerves: No cranial nerve deficit.     Sensory: No sensory deficit.     Coordination: Coordination normal.     Comments: Patient able to follow commands easily.  He does not answer questions directed at him.     ED Results / Procedures / Treatments   Labs (all labs ordered are listed, but only abnormal results are displayed) Labs Reviewed - No data to display  EKG None  Radiology CT HEAD WO CONTRAST  Result Date: 02/03/2021 CLINICAL DATA:  Unwitnessed fall, pt denies head injury, was ambulatory on EMS arrival, no change in behavior per staff at facility, hx of dementia, EXAM: CT HEAD WITHOUT CONTRAST CT CERVICAL SPINE WITHOUT CONTRAST TECHNIQUE: Multidetector CT imaging of the head and cervical spine was performed following the standard protocol without intravenous contrast. Multiplanar CT image reconstructions of the cervical spine were also generated. COMPARISON:  [facility]ri head 12/11/13. FINDINGS: CT HEAD FINDINGS Brain: Cerebral ventricle sizes are concordant with the degree of cerebral volume loss. Patchy and confluent areas of decreased attenuation are noted throughout the deep and periventricular white matter of the cerebral hemispheres bilaterally, compatible with chronic microvascular  ischemic disease. No evidence of large-territorial acute infarction. No parenchymal hemorrhage. No mass lesion. No extra-axial collection. No mass effect or midline shift. No hydrocephalus. Basilar cisterns are patent. Vascular: No hyperdense vessel. Atherosclerotic calcifications are present within the cavernous internal carotid arteries. Skull: No acute fracture or focal lesion. Sinuses/Orbits: Paranasal sinuses and mastoid air cells are clear. The orbits are unremarkable. Other: None. CT CERVICAL SPINE FINDINGS Alignment: Normal. Skull base and vertebrae: Multilevel degenerative changes of the spine with associated moderate severe osseous neural foraminal stenosis. No  acute fracture. No aggressive appearing focal osseous lesion or focal pathologic process. Soft tissues and spinal canal: No prevertebral fluid or swelling. No visible canal hematoma. Upper chest: Punctate calcification at the right apex. Other: None. IMPRESSION: 1. No acute intracranial abnormality. 2. No acute displaced fracture or traumatic listhesis of the cervical spine. Electronically Signed   By: Iven Finn M.D.   On: 02/03/2021 03:22   CT CERVICAL SPINE WO CONTRAST  Result Date: 02/03/2021 CLINICAL DATA:  Unwitnessed fall, pt denies head injury, was ambulatory on EMS arrival, no change in behavior per staff at facility, hx of dementia, EXAM: CT HEAD WITHOUT CONTRAST CT CERVICAL SPINE WITHOUT CONTRAST TECHNIQUE: Multidetector CT imaging of the head and cervical spine was performed following the standard protocol without intravenous contrast. Multiplanar CT image reconstructions of the cervical spine were also generated. COMPARISON:  [facility] head 12/11/13. FINDINGS: CT HEAD FINDINGS Brain: Cerebral ventricle sizes are concordant with the degree of cerebral volume loss. Patchy and confluent areas of decreased attenuation are noted throughout the deep and periventricular white matter of the cerebral hemispheres bilaterally, compatible with  chronic microvascular ischemic disease. No evidence of large-territorial acute infarction. No parenchymal hemorrhage. No mass lesion. No extra-axial collection. No mass effect or midline shift. No hydrocephalus. Basilar cisterns are patent. Vascular: No hyperdense vessel. Atherosclerotic calcifications are present within the cavernous internal carotid arteries. Skull: No acute fracture or focal lesion. Sinuses/Orbits: Paranasal sinuses and mastoid air cells are clear. The orbits are unremarkable. Other: None. CT CERVICAL SPINE FINDINGS Alignment: Normal. Skull base and vertebrae: Multilevel degenerative changes of the spine with associated moderate severe osseous neural foraminal stenosis. No acute fracture. No aggressive appearing focal osseous lesion or focal pathologic process. Soft tissues and spinal canal: No prevertebral fluid or swelling. No visible canal hematoma. Upper chest: Punctate calcification at the right apex. Other: None. IMPRESSION: 1. No acute intracranial abnormality. 2. No acute displaced fracture or traumatic listhesis of the cervical spine. Electronically Signed   By: Iven Finn M.D.   On: 02/03/2021 03:22    Procedures Procedures   Medications Ordered in ED Medications - No data to display  ED Course  I have reviewed the triage vital signs and the nursing notes.  Pertinent labs & imaging results that were available during my care of the patient were reviewed by me and considered in my medical decision making (see chart for details).    MDM Rules/Calculators/A&P                          Patient sent to the emergency department for evaluation after unwitnessed fall.  Patient appears well at arrival.  He is alert and at his neurologic baseline.  He has been ambulatory prior to arrival in the department.  No evidence of hip or lower extremity injury.  Upper extremities appear atraumatic as well.  No midline tenderness in his cervical spine, thoracic spine or lumbar spine.   I do not see any obvious contusions or evidence of injury to the head but based on his dementia, empirically did perform CT head and cervical spine that did not show any acute pathology.  He will be appropriate for return to nursing home.  Final Clinical Impression(s) / ED Diagnoses Final diagnoses:  Fall, initial encounter    Rx / DC Orders ED Discharge Orders    None       Conal Shetley, Gwenyth Allegra, MD 02/03/21 [phone]

## 2021-02-04 DIAGNOSIS — R2681 Unsteadiness on feet: Secondary | ICD-10-CM | POA: Diagnosis not present

## 2021-02-04 DIAGNOSIS — W19XXXS Unspecified fall, sequela: Secondary | ICD-10-CM | POA: Diagnosis not present

## 2021-02-04 DIAGNOSIS — M6281 Muscle weakness (generalized): Secondary | ICD-10-CM | POA: Diagnosis not present

## 2021-02-08 DIAGNOSIS — Z9181 History of falling: Secondary | ICD-10-CM | POA: Diagnosis not present

## 2021-02-08 DIAGNOSIS — M6281 Muscle weakness (generalized): Secondary | ICD-10-CM | POA: Diagnosis not present

## 2021-02-08 DIAGNOSIS — Z79899 Other long term (current) drug therapy: Secondary | ICD-10-CM | POA: Diagnosis not present

## 2021-02-08 DIAGNOSIS — Z993 Dependence on wheelchair: Secondary | ICD-10-CM | POA: Diagnosis not present

## 2021-02-08 DIAGNOSIS — G309 Alzheimer's disease, unspecified: Secondary | ICD-10-CM | POA: Diagnosis not present

## 2021-02-09 ENCOUNTER — Other Ambulatory Visit: Payer: Self-pay

## 2021-02-09 ENCOUNTER — Emergency Department (HOSPITAL_COMMUNITY): Payer: Medicare Other

## 2021-02-09 ENCOUNTER — Encounter (HOSPITAL_COMMUNITY): Payer: Self-pay

## 2021-02-09 ENCOUNTER — Emergency Department (HOSPITAL_COMMUNITY)
Admission: EM | Admit: 2021-02-09 | Discharge: 2021-02-09 | Disposition: A | Discharge status: Home / Self Care | Payer: Medicare Other | Attending: Emergency Medicine | Admitting: Emergency Medicine

## 2021-02-09 DIAGNOSIS — M2550 Pain in unspecified joint: Secondary | ICD-10-CM | POA: Diagnosis not present

## 2021-02-09 DIAGNOSIS — R404 Transient alteration of awareness: Secondary | ICD-10-CM | POA: Diagnosis not present

## 2021-02-09 DIAGNOSIS — Z87891 Personal history of nicotine dependence: Secondary | ICD-10-CM | POA: Diagnosis not present

## 2021-02-09 DIAGNOSIS — Z7401 Bed confinement status: Secondary | ICD-10-CM | POA: Diagnosis not present

## 2021-02-09 DIAGNOSIS — E039 Hypothyroidism, unspecified: Secondary | ICD-10-CM | POA: Insufficient documentation

## 2021-02-09 DIAGNOSIS — Z743 Need for continuous supervision: Secondary | ICD-10-CM | POA: Diagnosis not present

## 2021-02-09 DIAGNOSIS — R6889 Other general symptoms and signs: Secondary | ICD-10-CM | POA: Diagnosis not present

## 2021-02-09 DIAGNOSIS — S0990XA Unspecified injury of head, initial encounter: Secondary | ICD-10-CM | POA: Diagnosis not present

## 2021-02-09 DIAGNOSIS — Z79899 Other long term (current) drug therapy: Secondary | ICD-10-CM | POA: Insufficient documentation

## 2021-02-09 DIAGNOSIS — F0390 Unspecified dementia without behavioral disturbance: Secondary | ICD-10-CM | POA: Diagnosis present

## 2021-02-09 DIAGNOSIS — I517 Cardiomegaly: Secondary | ICD-10-CM | POA: Diagnosis not present

## 2021-02-09 DIAGNOSIS — W19XXXA Unspecified fall, initial encounter: Secondary | ICD-10-CM | POA: Insufficient documentation

## 2021-02-09 DIAGNOSIS — Z043 Encounter for examination and observation following other accident: Secondary | ICD-10-CM | POA: Diagnosis not present

## 2021-02-09 NOTE — ED Provider Notes (Signed)
[facility] DEPT Provider Note   CSN: [id number] Arrival date & time: 02/09/21  0730     History Chief Complaint  Patient presents with  . Fall    ADOLPHE FORTUNATO is a 87 y.o. male.  87yo M w/ PMH including dementia, HLD, hypothyroidism who p/w fall. Per nursing facility, pt was found on the floor this morning, possibly had an unwitnessed fall. He has hx of frequent falls. He is at baseline for known dementia. No anticoagulant use.  The history is provided by the EMS personnel and the nursing home.  Fall       Past Medical History:  Diagnosis Date  . Dementia ([place])   . Hernia, inguinal, right   . Hypercholesterolemia   . Hyperlipidemia   . Hypothyroidism   . Left renal mass     Patient Active Problem List   Diagnosis Date Noted  . Severe dementia ([place]) 04/28/2016    Past Surgical History:  Procedure Laterality Date  . APPENDECTOMY    . BACK SURGERY    . PARTIAL NEPHRECTOMY     left  . PROSTATECTOMY         Family History  Problem Relation Age of Onset  . Dementia Mother   . Aneurysm Father        brain  . Dementia Sister     Social History   Tobacco Use  . Smoking status: Former Research scientist (life sciences)  . Smokeless tobacco: Never Used  Substance Use Topics  . Alcohol use: Not Currently  . Drug use: No    Home Medications Prior to Admission medications   Medication Sig Start Date End Date Taking? Authorizing Provider  ALPRAZolam Duanne Moron) 1 MG tablet Take 1 mg by mouth every 6 (six) hours as needed for anxiety.    [provider]  CRESTOR 40 MG tablet Take 1 tablet by mouth daily. 12/05/13   [provider]  Dextromethorphan-quiNIDine (NUEDEXTA) 20-10 MG capsule Take 1 capsule by mouth 2 (two) times daily. Patient not taking: Reported on 02/03/2021 06/17/20   Penumalli, Earlean Polka, MD  donepezil (ARICEPT) 10 MG tablet Take 10 mg by mouth daily. Patient not taking: Reported on 02/03/2021 05/22/20   [provider]   levothyroxine (SYNTHROID) 75 MCG tablet Take 75 mcg by mouth daily. 05/01/20   [provider]  LORazepam (ATIVAN) 0.5 MG tablet Take 1 tablet (0.5 mg total) by mouth daily as needed for anxiety or sedation (as needed before personal care; max 10 per month). Patient not taking: Reported on 02/03/2021 07/09/20   Penumalli, Earlean Polka, MD  melatonin 5 MG TABS Take 1 tablet by mouth at bedtime.    [provider]  memantine (NAMENDA) 10 MG tablet Take 10 mg by mouth at bedtime. 05/22/20   [provider]  Memantine HCl-Donepezil HCl (NAMZARIC) 28-10 MG CP24 Take 1 capsule by mouth daily. Patient not taking: Reported on 02/03/2021 04/26/18   Penumalli, Earlean Polka, MD  OLANZapine (ZYPREXA) 5 MG tablet Take 5 mg by mouth 2 (two) times daily.    [provider]  sertraline (ZOLOFT) 25 MG tablet Take 25 mg by mouth daily. 02/13/18   [provider]  Vitamin D, Ergocalciferol, (DRISDOL) 1.25 MG (50000 UNIT) CAPS capsule 1 capsule. Once every two weeks 03/04/20   [provider]    Allergies    Seroquel [quetiapine] and Other  Review of Systems   Review of Systems  Unable to perform ROS: Dementia  Physical Exam Updated Vital Signs BP (!) 145/103   Pulse 92   Temp 97.9 F (36.6 C) (Oral)   Resp 16   SpO2 97%   Physical Exam Vitals and nursing note reviewed.  Constitutional:      General: He is not in acute distress.    Comments: Thin, frail elderly man laying on right side, picking at monitor  HENT:     Head: Normocephalic and atraumatic.  Eyes:     Comments: Bilateral conjunctival injection  Cardiovascular:     Rate and Rhythm: Normal rate and regular rhythm.     Heart sounds: Normal heart sounds. No murmur heard.   Pulmonary:     Effort: Pulmonary effort is normal.     Breath sounds: Normal breath sounds.  Chest:     Chest wall: No tenderness.  Abdominal:     General: Abdomen is flat. Bowel sounds are normal. There is no  distension.     Palpations: Abdomen is soft.     Tenderness: There is no abdominal tenderness.  Musculoskeletal:        General: No swelling, tenderness, deformity or signs of injury. Normal range of motion.     Cervical back: Neck supple. No tenderness.  Skin:    General: Skin is warm and dry.     Comments: Healing bruises b/l knees  Neurological:     Mental Status: He is alert.     Comments: Alert, disoriented, mumbling to self, not following commands, moving arms spontaneously; some rigidity of extremities with ROM testing  Psychiatric:        Cognition and Memory: Cognition is impaired. Memory is impaired.     ED Results / Procedures / Treatments   Labs (all labs ordered are listed, but only abnormal results are displayed) Labs Reviewed - No data to display  EKG None  Radiology DG Chest 2 View  Result Date: 02/09/2021 CLINICAL DATA:  87 year old male status post unwitnessed fall. EXAM: CHEST - 2 VIEW COMPARISON:  Chest radiographs 03/17/2020 and earlier. FINDINGS: Semi upright AP and lateral views of the chest. The arms are not raised on the lateral view. Lung volumes are lower than baseline. Mediastinal contours are stable, borderline to mild cardiomegaly and tortuosity of the thoracic aorta. Visualized tracheal air column is within normal limits. The lungs appear stable and clear. No pneumothorax or pleural effusion. No acute osseous abnormality identified. Negative visible bowel gas pattern. IMPRESSION: No acute cardiopulmonary abnormality or acute traumatic injury identified. Electronically Signed   By: Genevie Ann M.D.   On: 02/09/2021 09:26   DG Pelvis 1-2 Views  Result Date: 02/09/2021 CLINICAL DATA:  87 year old male status post unwitnessed fall. EXAM: PELVIS - 1-2 VIEW COMPARISON:  [facility] CT Chest, Abdomen, and Pelvis 11/29/2012. FINDINGS: AP view at 0915 hours. Femoral heads are normally located. Hip joint spaces appear symmetric and normal for age.  Grossly intact proximal femurs. No pelvis fracture identified. SI joints appear normal. Chronic bilateral pelvic surgical clips. Partially visible lumbar disc and endplate degeneration. Negative visible bowel gas pattern. IMPRESSION: No acute fracture or dislocation identified about the pelvis. Electronically Signed   By: Genevie Ann M.D.   On: 02/09/2021 09:27   CT Head Wo Contrast  Result Date: 02/09/2021 CLINICAL DATA:  87 year old male with history of dementia presenting with history of trauma from an unwitnessed fall. EXAM: CT HEAD WITHOUT CONTRAST TECHNIQUE: Contiguous axial images were obtained from the base of the skull through the vertex without intravenous  contrast. COMPARISON:  Head CT 02/03/2021. FINDINGS: Brain: Moderate cerebral and mild cerebellar atrophy. Severe patchy and confluent areas of decreased attenuation are noted throughout the deep and periventricular white matter of the cerebral hemispheres bilaterally, compatible with chronic microvascular ischemic disease. No evidence of acute infarction, hemorrhage, hydrocephalus, extra-axial collection or mass lesion/mass effect. Vascular: No hyperdense vessel. Atherosclerotic calcifications in the vessels throughout the skull base. Skull: Normal. Negative for fracture or focal lesion. Sinuses/Orbits: Mild multifocal mucosal thickening in the left maxillary sinus. No air-fluid levels. Other: None. IMPRESSION: 1. No evidence of significant acute traumatic injury to the skull or brain. 2. Moderate cerebral and mild cerebellar atrophy with extensive chronic microvascular ischemic changes in the cerebral white matter, as above. 3. Atherosclerosis. Electronically Signed   By: Vinnie Langton M.D.   On: 02/09/2021 09:16    Procedures Procedures   Medications Ordered in ED Medications - No data to display  ED Course  I have reviewed the triage vital signs and the nursing notes.  Pertinent imaging results that were available during my care of the  patient were reviewed by me and considered in my medical decision making (see chart for details).    MDM Rules/Calculators/A&P                          PT alert, NAD on exam, hypertensive but otherwise stable. No external trauma aside from old bruising on knees. Obtained screening CT head, XR chest and pelvis. Imaging negative acute. Given no focal areas of pain, pt stable for d/c back to nursing facility.  Final Clinical Impression(s) / ED Diagnoses Final diagnoses:  Fall, initial encounter    Rx / DC Orders ED Discharge Orders    None       Jahkai Yandell, Wenda Overland, MD 02/09/21 [phone]

## 2021-02-09 NOTE — ED Triage Notes (Signed)
EMS reports from [facility], unwitnessed fall this morning, no obvious injuries, no c/o pain. Hx of dementia at baseline. Staff states no LOC, no blood thinners.  BP 132/70 HR 72 RR 18

## 2021-02-12 DIAGNOSIS — G309 Alzheimer's disease, unspecified: Secondary | ICD-10-CM | POA: Diagnosis not present

## 2021-02-12 DIAGNOSIS — M6281 Muscle weakness (generalized): Secondary | ICD-10-CM | POA: Diagnosis not present

## 2021-02-12 DIAGNOSIS — Z79899 Other long term (current) drug therapy: Secondary | ICD-10-CM | POA: Diagnosis not present

## 2021-02-12 DIAGNOSIS — Z9181 History of falling: Secondary | ICD-10-CM | POA: Diagnosis not present

## 2021-02-12 DIAGNOSIS — Z993 Dependence on wheelchair: Secondary | ICD-10-CM | POA: Diagnosis not present

## 2021-02-16 DIAGNOSIS — Z79899 Other long term (current) drug therapy: Secondary | ICD-10-CM | POA: Diagnosis not present

## 2021-02-16 DIAGNOSIS — Z993 Dependence on wheelchair: Secondary | ICD-10-CM | POA: Diagnosis not present

## 2021-02-16 DIAGNOSIS — G309 Alzheimer's disease, unspecified: Secondary | ICD-10-CM | POA: Diagnosis not present

## 2021-02-16 DIAGNOSIS — M6281 Muscle weakness (generalized): Secondary | ICD-10-CM | POA: Diagnosis not present

## 2021-02-16 DIAGNOSIS — Z9181 History of falling: Secondary | ICD-10-CM | POA: Diagnosis not present

## 2021-02-18 DIAGNOSIS — M6281 Muscle weakness (generalized): Secondary | ICD-10-CM | POA: Diagnosis not present

## 2021-02-18 DIAGNOSIS — W19XXXS Unspecified fall, sequela: Secondary | ICD-10-CM | POA: Diagnosis not present

## 2021-02-18 DIAGNOSIS — R2681 Unsteadiness on feet: Secondary | ICD-10-CM | POA: Diagnosis not present

## 2021-02-24 DIAGNOSIS — G309 Alzheimer's disease, unspecified: Secondary | ICD-10-CM | POA: Diagnosis not present

## 2021-02-24 DIAGNOSIS — M6281 Muscle weakness (generalized): Secondary | ICD-10-CM | POA: Diagnosis not present

## 2021-02-24 DIAGNOSIS — Z9181 History of falling: Secondary | ICD-10-CM | POA: Diagnosis not present

## 2021-02-24 DIAGNOSIS — Z993 Dependence on wheelchair: Secondary | ICD-10-CM | POA: Diagnosis not present

## 2021-02-24 DIAGNOSIS — Z79899 Other long term (current) drug therapy: Secondary | ICD-10-CM | POA: Diagnosis not present

## 2021-02-25 ENCOUNTER — Emergency Department (HOSPITAL_COMMUNITY): Payer: Medicare Other

## 2021-02-25 ENCOUNTER — Encounter (HOSPITAL_COMMUNITY): Payer: Self-pay

## 2021-02-25 ENCOUNTER — Emergency Department (HOSPITAL_COMMUNITY)
Admission: EM | Admit: 2021-02-25 | Discharge: 2021-02-25 | Disposition: A | Discharge status: Home / Self Care | Payer: Medicare Other | Attending: Emergency Medicine | Admitting: Emergency Medicine

## 2021-02-25 DIAGNOSIS — R404 Transient alteration of awareness: Secondary | ICD-10-CM | POA: Diagnosis not present

## 2021-02-25 DIAGNOSIS — I10 Essential (primary) hypertension: Secondary | ICD-10-CM | POA: Insufficient documentation

## 2021-02-25 DIAGNOSIS — S80211A Abrasion, right knee, initial encounter: Secondary | ICD-10-CM | POA: Insufficient documentation

## 2021-02-25 DIAGNOSIS — M47812 Spondylosis without myelopathy or radiculopathy, cervical region: Secondary | ICD-10-CM | POA: Diagnosis not present

## 2021-02-25 DIAGNOSIS — M6281 Muscle weakness (generalized): Secondary | ICD-10-CM | POA: Diagnosis not present

## 2021-02-25 DIAGNOSIS — R531 Weakness: Secondary | ICD-10-CM | POA: Diagnosis not present

## 2021-02-25 DIAGNOSIS — F0390 Unspecified dementia without behavioral disturbance: Secondary | ICD-10-CM | POA: Diagnosis not present

## 2021-02-25 DIAGNOSIS — Z7401 Bed confinement status: Secondary | ICD-10-CM | POA: Diagnosis not present

## 2021-02-25 DIAGNOSIS — T148XXA Other injury of unspecified body region, initial encounter: Secondary | ICD-10-CM

## 2021-02-25 DIAGNOSIS — Y92009 Unspecified place in unspecified non-institutional (private) residence as the place of occurrence of the external cause: Secondary | ICD-10-CM

## 2021-02-25 DIAGNOSIS — I739 Peripheral vascular disease, unspecified: Secondary | ICD-10-CM | POA: Diagnosis not present

## 2021-02-25 DIAGNOSIS — Z79899 Other long term (current) drug therapy: Secondary | ICD-10-CM | POA: Diagnosis not present

## 2021-02-25 DIAGNOSIS — Z743 Need for continuous supervision: Secondary | ICD-10-CM | POA: Diagnosis not present

## 2021-02-25 DIAGNOSIS — Z87891 Personal history of nicotine dependence: Secondary | ICD-10-CM | POA: Diagnosis not present

## 2021-02-25 DIAGNOSIS — S40812A Abrasion of left upper arm, initial encounter: Secondary | ICD-10-CM | POA: Insufficient documentation

## 2021-02-25 DIAGNOSIS — W19XXXA Unspecified fall, initial encounter: Secondary | ICD-10-CM | POA: Diagnosis not present

## 2021-02-25 DIAGNOSIS — M2550 Pain in unspecified joint: Secondary | ICD-10-CM | POA: Diagnosis not present

## 2021-02-25 DIAGNOSIS — G9389 Other specified disorders of brain: Secondary | ICD-10-CM | POA: Diagnosis not present

## 2021-02-25 DIAGNOSIS — R2681 Unsteadiness on feet: Secondary | ICD-10-CM | POA: Diagnosis not present

## 2021-02-25 DIAGNOSIS — E039 Hypothyroidism, unspecified: Secondary | ICD-10-CM | POA: Insufficient documentation

## 2021-02-25 DIAGNOSIS — S8991XA Unspecified injury of right lower leg, initial encounter: Secondary | ICD-10-CM | POA: Diagnosis present

## 2021-02-25 DIAGNOSIS — W06XXXA Fall from bed, initial encounter: Secondary | ICD-10-CM | POA: Diagnosis not present

## 2021-02-25 DIAGNOSIS — Z043 Encounter for examination and observation following other accident: Secondary | ICD-10-CM | POA: Diagnosis not present

## 2021-02-25 NOTE — ED Notes (Signed)
Attempted to call report x2 to [facility] with no answer

## 2021-02-25 NOTE — ED Provider Notes (Signed)
[facility] DEPT Provider Note   CSN: [id number] Arrival date & time: 02/25/21  4709     History Chief Complaint  Patient presents with  . Fall    Ralph Jordan is a 87 y.o. male with a past medical history of dementia, hyperlipidemia presenting to the ED with a chief complaint of fall.  Patient unwitnessed fall from bed and found on his right side.  Reports possible head injury with a bump on his right side.  Also reports skin tear to right knee and left shoulder.  Typically is able to ambulate on his own.  They state that his mental status is at baseline. Level 5 caveat secondary to dementia.  HPI     Past Medical History:  Diagnosis Date  . Dementia ([place])   . Hernia, inguinal, right   . Hypercholesterolemia   . Hyperlipidemia   . Hypothyroidism   . Left renal mass     Patient Active Problem List   Diagnosis Date Noted  . Severe dementia ([place]) 04/28/2016    Past Surgical History:  Procedure Laterality Date  . APPENDECTOMY    . BACK SURGERY    . PARTIAL NEPHRECTOMY     left  . PROSTATECTOMY         Family History  Problem Relation Age of Onset  . Dementia Mother   . Aneurysm Father        brain  . Dementia Sister     Social History   Tobacco Use  . Smoking status: Former Research scientist (life sciences)  . Smokeless tobacco: Never Used  Substance Use Topics  . Alcohol use: Not Currently  . Drug use: No    Home Medications Prior to Admission medications   Medication Sig Start Date End Date Taking? Authorizing Provider  ALPRAZolam Duanne Moron) 1 MG tablet Take 1 mg by mouth every 6 (six) hours as needed for anxiety.    [provider]  CRESTOR 40 MG tablet Take 1 tablet by mouth daily. 12/05/13   [provider]  Dextromethorphan-quiNIDine (NUEDEXTA) 20-10 MG capsule Take 1 capsule by mouth 2 (two) times daily. Patient not taking: Reported on 02/03/2021 06/17/20   Penumalli, Earlean Polka, MD  donepezil (ARICEPT) 10 MG tablet Take 10 mg  by mouth daily. Patient not taking: Reported on 02/03/2021 05/22/20   [provider]  levothyroxine (SYNTHROID) 75 MCG tablet Take 75 mcg by mouth daily. 05/01/20   [provider]  LORazepam (ATIVAN) 0.5 MG tablet Take 1 tablet (0.5 mg total) by mouth daily as needed for anxiety or sedation (as needed before personal care; max 10 per month). Patient not taking: Reported on 02/03/2021 07/09/20   Penumalli, Earlean Polka, MD  melatonin 5 MG TABS Take 1 tablet by mouth at bedtime.    [provider]  memantine (NAMENDA) 10 MG tablet Take 10 mg by mouth at bedtime. 05/22/20   [provider]  Memantine HCl-Donepezil HCl (NAMZARIC) 28-10 MG CP24 Take 1 capsule by mouth daily. Patient not taking: Reported on 02/03/2021 04/26/18   Penumalli, Earlean Polka, MD  OLANZapine (ZYPREXA) 5 MG tablet Take 5 mg by mouth 2 (two) times daily.    [provider]  sertraline (ZOLOFT) 25 MG tablet Take 25 mg by mouth daily. 02/13/18   [provider]  Vitamin D, Ergocalciferol, (DRISDOL) 1.25 MG (50000 UNIT) CAPS capsule 1 capsule. Once every two weeks 03/04/20   [provider]    Allergies    Seroquel [quetiapine] and Other  Review of Systems   Review of Systems  Unable to perform ROS: Dementia    Physical Exam Updated Vital Signs BP 137/78 (BP Location: Right Arm)   Pulse 76   Temp 97.6 F (36.4 C) (Oral)   Resp 18   SpO2 100%   Physical Exam Vitals and nursing note reviewed.  Constitutional:      General: He is not in acute distress.    Appearance: He is well-developed. He is not diaphoretic.  HENT:     Head: Normocephalic and atraumatic.     Nose: Nose normal.  Eyes:     General: No scleral icterus.       Right eye: No discharge.        Left eye: No discharge.     Conjunctiva/sclera: Conjunctivae normal.     Pupils: Pupils are equal, round, and reactive to light.  Cardiovascular:     Rate and Rhythm: Normal rate and regular rhythm.     Heart  sounds: Normal heart sounds. No murmur heard. No friction rub. No gallop.   Pulmonary:     Effort: Pulmonary effort is normal. No respiratory distress.     Breath sounds: Normal breath sounds.  Abdominal:     General: Bowel sounds are normal. There is no distension.     Palpations: Abdomen is soft.     Tenderness: There is no abdominal tenderness. There is no guarding.  Musculoskeletal:        General: Normal range of motion.     Cervical back: Normal range of motion and neck supple.  Skin:    General: Skin is warm and dry.     Findings: Abrasion present. No rash.     Comments: Abrasion noted to right knee, left upper arm.  No active bleeding.  No deep lacerations noted.  No abrasions noted on back.  No ecchymosis noted on torso.  Neurological:     Mental Status: He is alert. Mental status is at baseline.     Motor: No weakness or abnormal muscle tone.     Coordination: Coordination normal.     Comments: Patient unwilling to participate in exam but does demonstrate 5/5 strength in bilateral upper and lower extremities when I am attempting to move him.     ED Results / Procedures / Treatments   Labs (all labs ordered are listed, but only abnormal results are displayed) Labs Reviewed - No data to display  EKG None  Radiology DG Knee 2 Views Right  Result Date: 02/25/2021 CLINICAL DATA:  87 year old male status post unwitnessed fall. Found down. EXAM: RIGHT KNEE - 1-2 VIEW COMPARISON:  None. FINDINGS: Cross-table lateral view. Bone mineralization is within normal limits for age. No joint effusion. Patella intact. Joint spaces and alignment normal for age. Calcified peripheral vascular disease. Fat stranding in Hoffa's fat pad. But no acute osseous abnormality identified. IMPRESSION: 1. Possible soft tissue injury in Hoffa's fat pad with no acute fracture or dislocation identified about the right knee. 2. Calcified peripheral vascular disease. Electronically Signed   By: Genevie Ann M.D.    On: 02/25/2021 07:35   CT Head Wo Contrast  Result Date: 02/25/2021 CLINICAL DATA:  Unwitnessed fall out of bed, dementia EXAM: CT HEAD WITHOUT CONTRAST CT CERVICAL SPINE WITHOUT CONTRAST TECHNIQUE: Multidetector CT imaging of the head and cervical spine was performed following the standard protocol without intravenous contrast. Multiplanar CT image reconstructions of the cervical spine were also generated. COMPARISON:  MR head 03/12/2012; CT scan of  the head 02/09/2021 FINDINGS: CT HEAD FINDINGS Brain: No evidence of acute infarction, hemorrhage, hydrocephalus, extra-axial collection or mass lesion/mass effect. Marked cortical and central atrophy with ex vacuo dilatation of the lateral ventricles. Confluent periventricular and subcortical white matter hypoattenuation consistent with advanced chronic microvascular ischemic white matter changes. Vascular: No hyperdense vessel or unexpected calcification. Skull: Normal. Negative for fracture or focal lesion. Sinuses/Orbits: No acute finding. Other: None. CT CERVICAL SPINE FINDINGS Alignment: Normal. Skull base and vertebrae: No acute fracture. No primary bone lesion or focal pathologic process. Soft tissues and spinal canal: No prevertebral fluid or swelling. No visible canal hematoma. Disc levels: Multilevel cervical spondylosis. Minimal degenerative anterolisthesis of C3 on C4. Loss of disc space height and osteophyte production most significant at C4-C5 and C6-C7. Bilateral facet arthropathy at C4-C5. Upper chest: Mild centrilobular emphysema.  No acute abnormality. Other: None IMPRESSION: CT HEAD 1. No acute intracranial abnormality. 2. Atrophy and advanced chronic microvascular ischemic white matter disease. CT CSPINE 1. No acute fracture or malalignment. 2. Multilevel cervical spondylosis and facet arthropathy. 3. Mild centrilobular emphysema.  Emphysema (ICD10-J43.9). Electronically Signed   By: Jacqulynn Cadet M.D.   On: 02/25/2021 07:47   CT Cervical  Spine Wo Contrast  Result Date: 02/25/2021 CLINICAL DATA:  Unwitnessed fall out of bed, dementia EXAM: CT HEAD WITHOUT CONTRAST CT CERVICAL SPINE WITHOUT CONTRAST TECHNIQUE: Multidetector CT imaging of the head and cervical spine was performed following the standard protocol without intravenous contrast. Multiplanar CT image reconstructions of the cervical spine were also generated. COMPARISON:  MR head 03/12/2012; CT scan of the head 02/09/2021 FINDINGS: CT HEAD FINDINGS Brain: No evidence of acute infarction, hemorrhage, hydrocephalus, extra-axial collection or mass lesion/mass effect. Marked cortical and central atrophy with ex vacuo dilatation of the lateral ventricles. Confluent periventricular and subcortical white matter hypoattenuation consistent with advanced chronic microvascular ischemic white matter changes. Vascular: No hyperdense vessel or unexpected calcification. Skull: Normal. Negative for fracture or focal lesion. Sinuses/Orbits: No acute finding. Other: None. CT CERVICAL SPINE FINDINGS Alignment: Normal. Skull base and vertebrae: No acute fracture. No primary bone lesion or focal pathologic process. Soft tissues and spinal canal: No prevertebral fluid or swelling. No visible canal hematoma. Disc levels: Multilevel cervical spondylosis. Minimal degenerative anterolisthesis of C3 on C4. Loss of disc space height and osteophyte production most significant at C4-C5 and C6-C7. Bilateral facet arthropathy at C4-C5. Upper chest: Mild centrilobular emphysema.  No acute abnormality. Other: None IMPRESSION: CT HEAD 1. No acute intracranial abnormality. 2. Atrophy and advanced chronic microvascular ischemic white matter disease. CT CSPINE 1. No acute fracture or malalignment. 2. Multilevel cervical spondylosis and facet arthropathy. 3. Mild centrilobular emphysema.  Emphysema (ICD10-J43.9). Electronically Signed   By: Jacqulynn Cadet M.D.   On: 02/25/2021 07:47   DG Humerus Left  Result Date:  02/25/2021 CLINICAL DATA:  Fall. EXAM: LEFT HUMERUS - 2+ VIEW COMPARISON:  None. FINDINGS: Left acromioclavicular and glenohumeral degenerative change. Degenerative changes left elbow. No acute bony or joint abnormality. No evidence of fracture or dislocation. IMPRESSION: Left acromioclavicular glenohumeral degenerative change. Degenerative changes left elbow. No acute bony abnormality identified. Electronically Signed   By: Marcello Moores  Register   On: 02/25/2021 07:34    Procedures Procedures   Medications Ordered in ED Medications - No data to display  ED Course  I have reviewed the triage vital signs and the nursing notes.  Pertinent labs & imaging results that were available during my care of the patient were reviewed by me and considered  in my medical decision making (see chart for details).    MDM Rules/Calculators/A&P                          87 year old male presenting to the ED with a chief complaint of unwitnessed fall from nursing facility.  Patient found on right side with a bump on the right side of his head as well as a skin tear on his right knee and left arm.  Usually is able to ambulate on his own.  He is nonverbal at baseline.  Patient with abrasion noted to right knee and left arm.  No deformities noted.  No joint tenderness noted.  He is unwilling to participate in exam but demonstrates good strength while resisting my movement.  No facial asymmetry noted.  His vital signs are within normal limits.  CT of the head and cervical spine without any abnormalities.  X-rays of the right knee and left humerus without any acute findings. I spoke to patient's wife, Charlett Nose over the phone.  She was not contacted by the nursing facility to let her know the patient had fallen and was in the ER.  She voiced frustration because she did not want any imaging done on him.  I told her that when patient comes to the ER we typically do a medical screening exam in order necessary imaging or lab work based  on injuries.  She is mostly frustrated with nursing facility not informing her and states that she will speak to them about hopefully preventing this in the future.  She was told that if patient falls again that he will need to come to the ER due to facility policy but informed her that I would put in my note that if patient returns to the ER that we will contact her first to ask her about preferences for work-up.  Informed her of today's results.  Patient in no acute distress.  Return precautions given.   Patient is hemodynamically stable, in NAD. Evaluation does not show pathology that would require ongoing emergent intervention or inpatient treatment. I explained the diagnosis to the patient. Pain has been managed and has no complaints prior to discharge. Patient is comfortable with above plan and is stable for discharge at this time. All questions were answered prior to disposition. Strict return precautions for returning to the ED were discussed. Encouraged follow up with PCP.   An After Visit Summary was printed and given to the patient.   Portions of this note were generated with [employer]. Dictation errors may occur despite best attempts at proofreading.  Final Clinical Impression(s) / ED Diagnoses Final diagnoses:  Fall in home, initial encounter  Abrasion    Rx / DC Orders ED Discharge Orders    None       Delia Heady, PA-C 02/25/21 [place], [place], DO 02/25/21 1239

## 2021-02-25 NOTE — ED Triage Notes (Signed)
Pt BIB EMS from [facility]. Unwitnessed fall from bed, found on right side, bump on right head, skin tear right knee, skin tear left shoulder, Pt gets up on his own normally. Pt is nonverbal per baseline.   Vitals 70 HR  147 CBG 98.7 F

## 2021-02-25 NOTE — ED Notes (Signed)
Called [facility] for transportation back to [facility]

## 2021-02-25 NOTE — Discharge Instructions (Signed)
Continue your home medications as previously prescribed. Return to the ER if you start to experience chest pain, shortness of breath, severe headache or vision changes.

## 2021-02-25 NOTE — ED Notes (Signed)
Attempted to call [facility] for discharge with no answer. Will call back

## 2021-03-22 DEATH — deceased

## 2021-06-08 IMAGING — CR DG HUMERUS 2V *L*
3 series · 3 of 3 positions shown · non-contrast
Comparison: None.

CLINICAL DATA: Fall.

EXAM:
LEFT HUMERUS - 2+ VIEW

[x humerus ap left (1 of 3)]
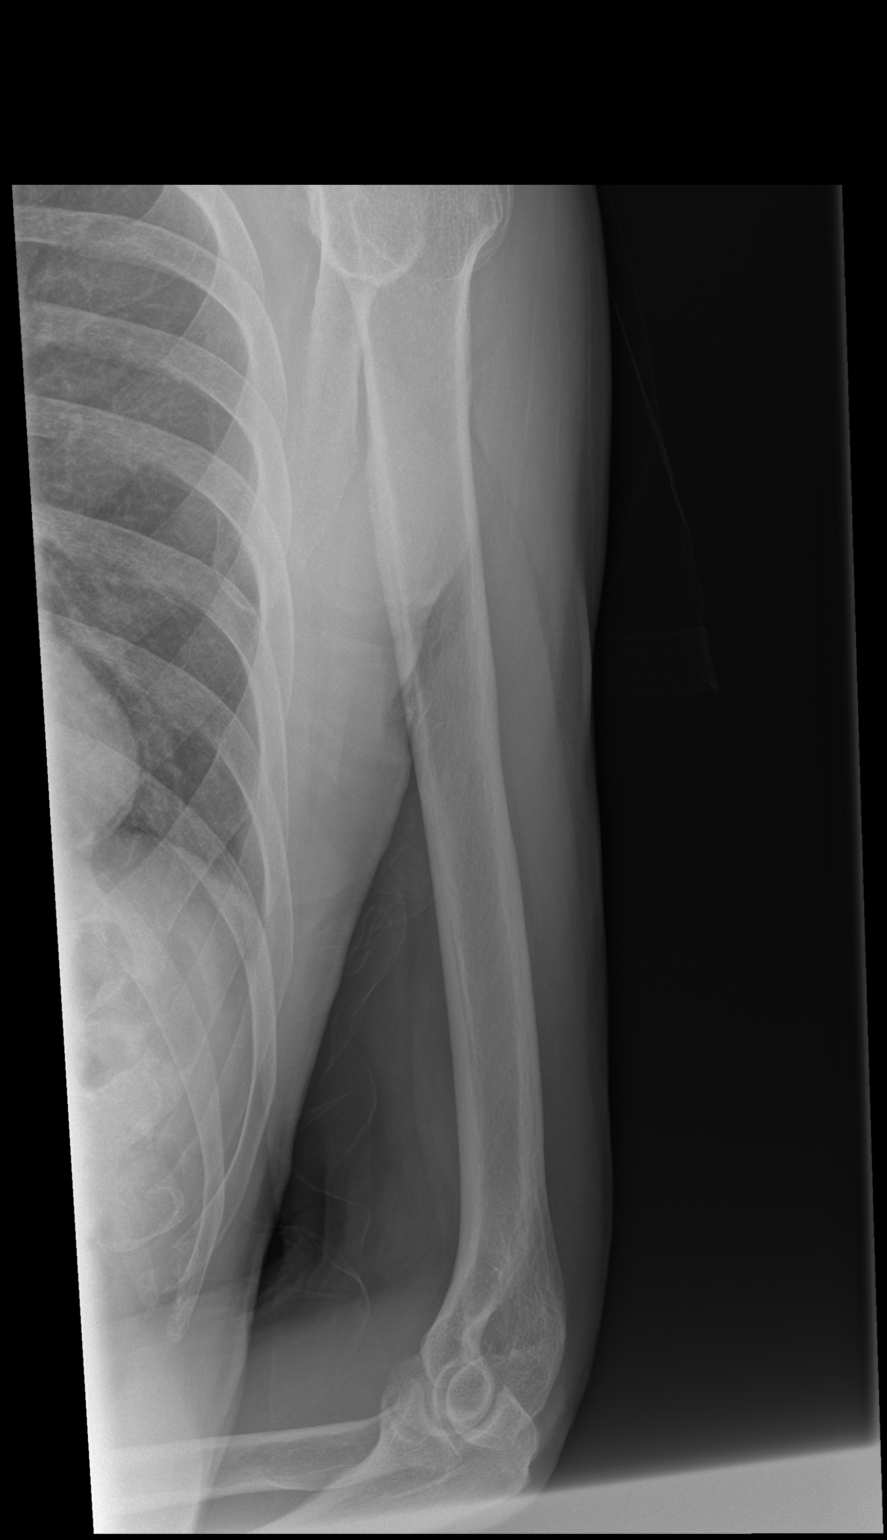

[x humerus ap left (2 of 3)]
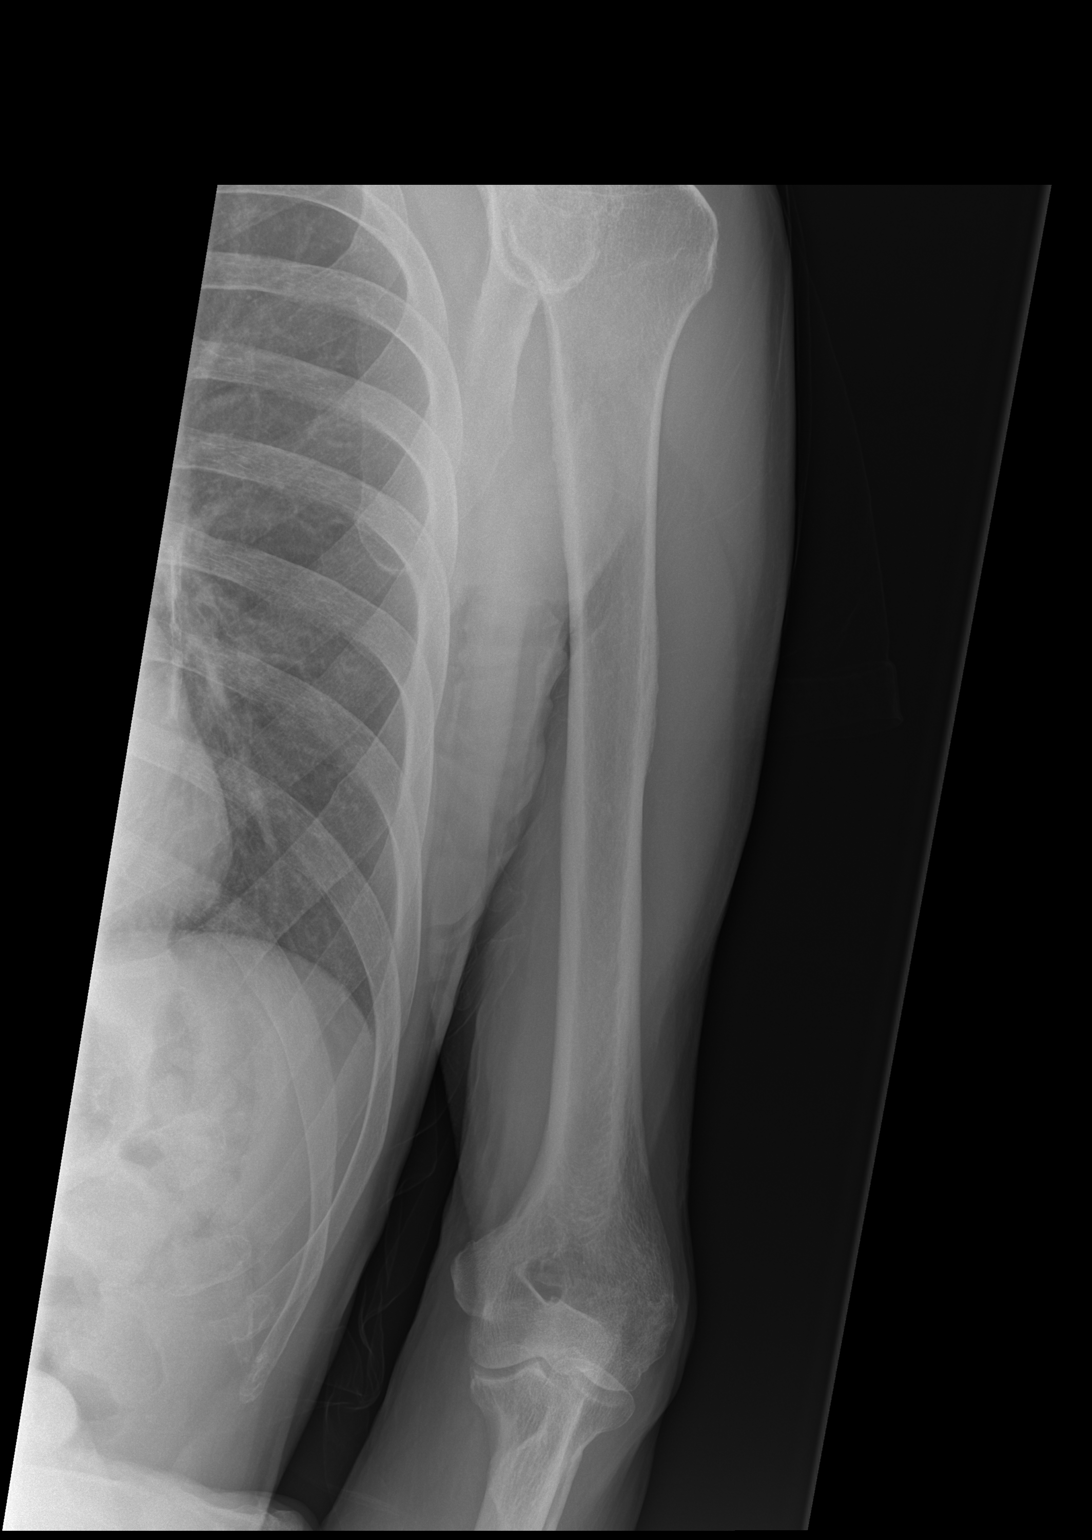

[x humerus ap left (3 of 3)]
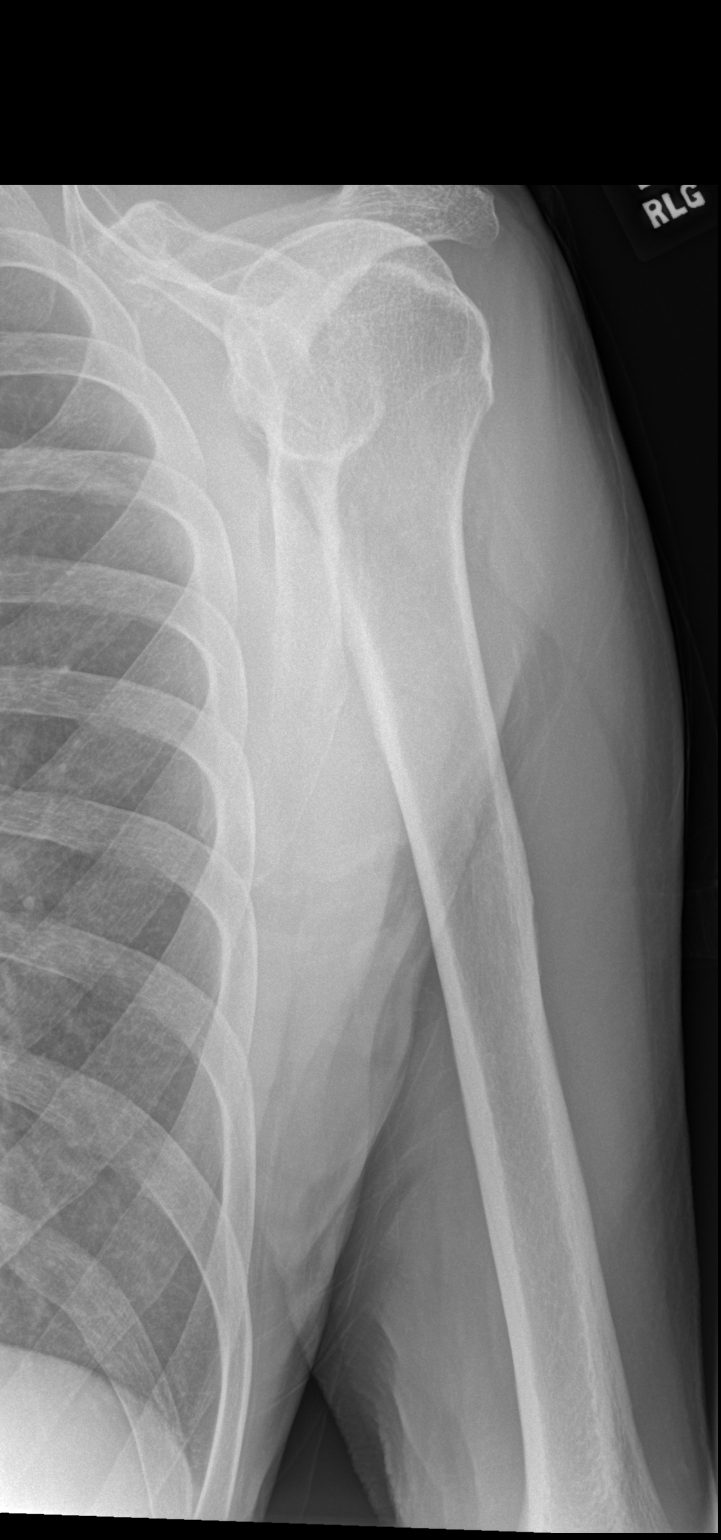

[3 of 3 positions shown; findings below may reference images not displayed]

FINDINGS: Left acromioclavicular and glenohumeral degenerative change.
Degenerative changes left elbow. No acute bony or joint abnormality.
No evidence of fracture or dislocation.
IMPRESSION: Left acromioclavicular glenohumeral degenerative change.
Degenerative changes left elbow. No acute bony abnormality
identified.

## 2021-06-08 IMAGING — CR DG KNEE 1-2V*R*
2 series · 2 of 2 positions shown · non-contrast
Comparison: None.

CLINICAL DATA: 87-year-old male status post unwitnessed fall. Found
down.

EXAM:
RIGHT KNEE - 1-2 VIEW

[x knee ap right]
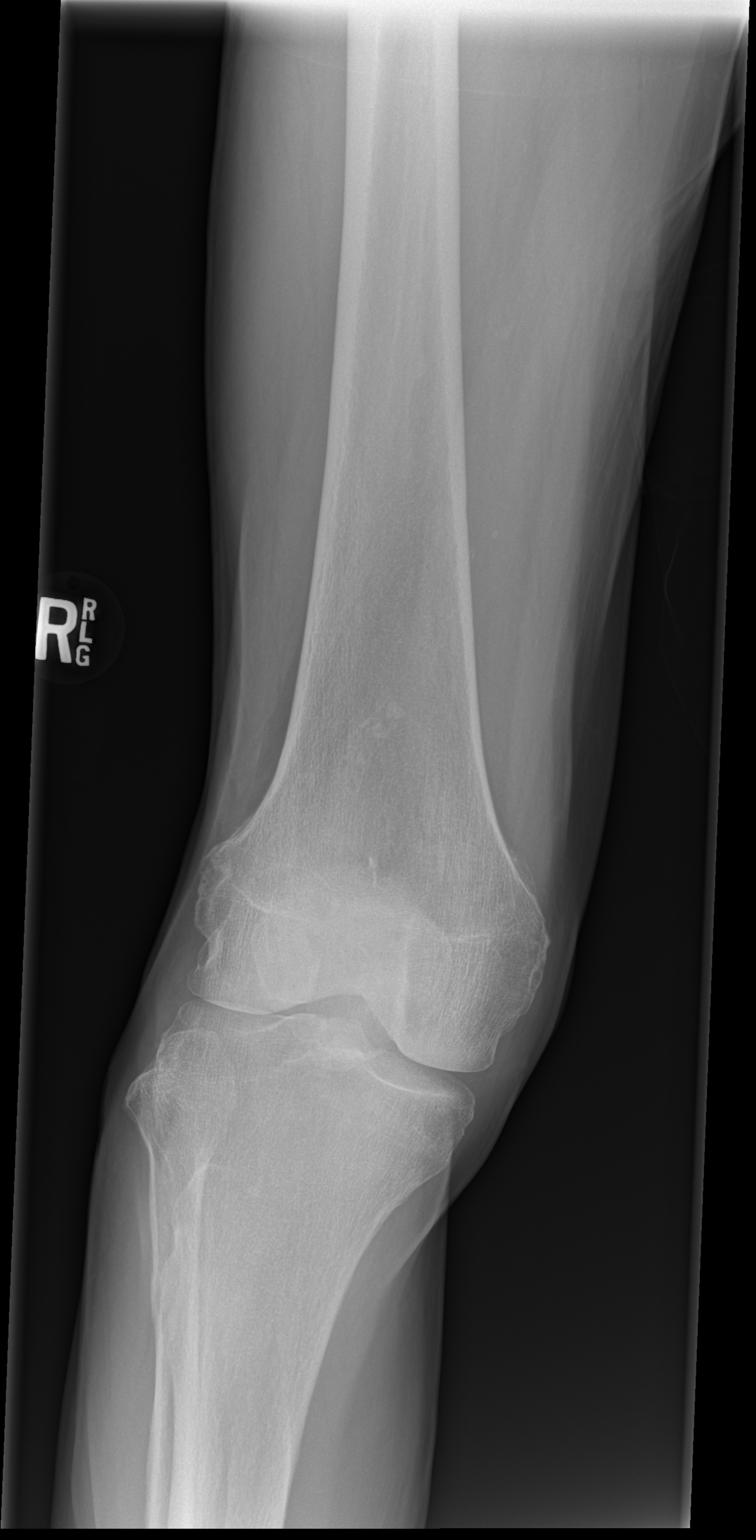

[x knee lat right]
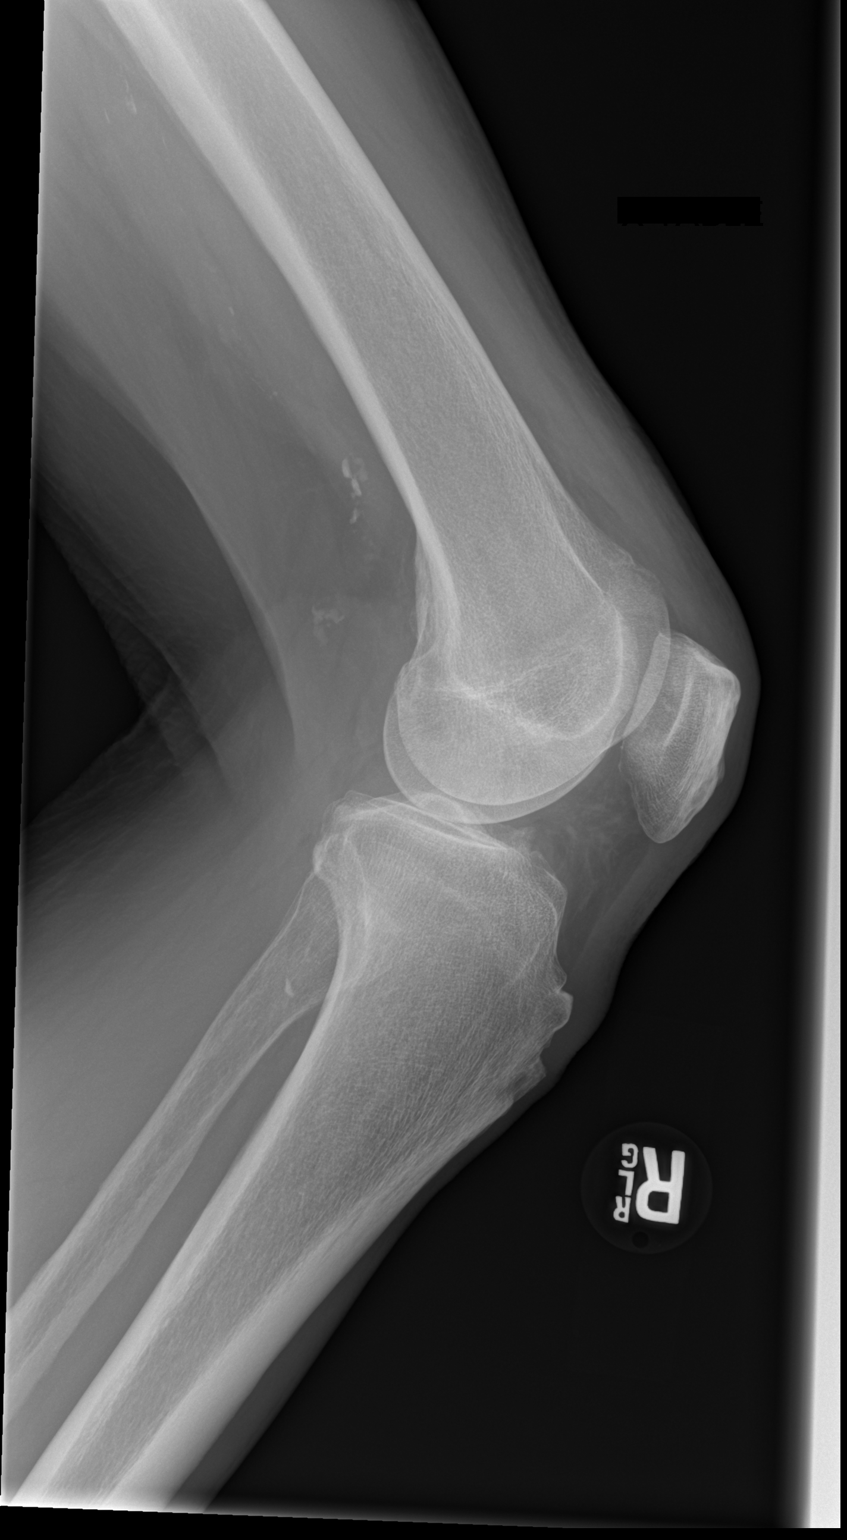

[2 of 2 positions shown; findings below may reference images not displayed]

FINDINGS: Cross-table lateral view. Bone mineralization is within normal
limits for age. No joint effusion. Patella intact. Joint spaces and
alignment normal for age. Calcified peripheral vascular disease. Fat
stranding in Hoffa's fat pad. But no acute osseous abnormality
identified.
IMPRESSION: 1. Possible soft tissue injury in Hoffa's fat pad with no acute
fracture or dislocation identified about the right knee.
2. Calcified peripheral vascular disease.

## 2021-06-08 IMAGING — CT CT CERVICAL SPINE W/O CM
3 of 4 series · 9 of 33 positions shown, 11 images · non-contrast
Comparison: MR head 03/12/2012; CT scan of the head 02/09/2021

CLINICAL DATA: Unwitnessed fall out of bed, dementia

EXAM:
CT HEAD WITHOUT CONTRAST
CT CERVICAL SPINE WITHOUT CONTRAST
TECHNIQUE: Multidetector CT imaging of the head and cervical spine was
performed following the standard protocol without intravenous
contrast. Multiplanar CT image reconstructions of the cervical spine
were also generated.

[Series 5: orthogonal bone · axial · 0.20mm/px · z∈[-170,-170]mm · 1 of 96 slices shown, 2 images]
[im 48/96  soft-tissue]
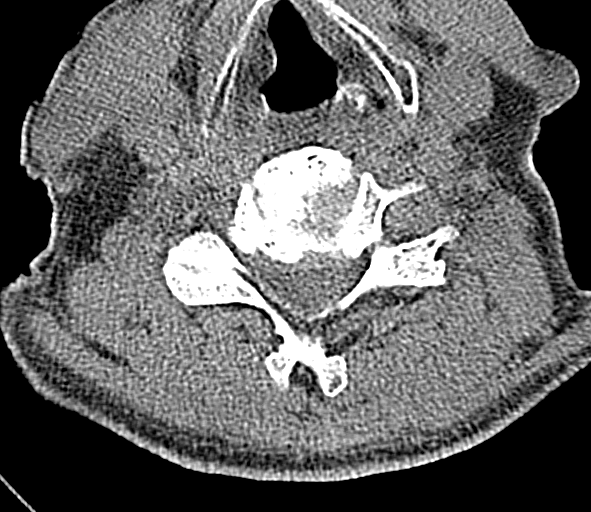
[im 48/96  bone]
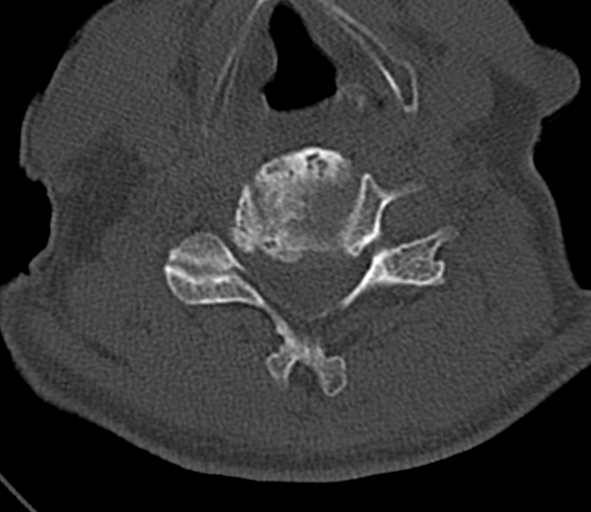

[Series 6: coronal bone · coronal · 0.21mm/px · 3 of 47 slices shown]
[im 10/47  bone]
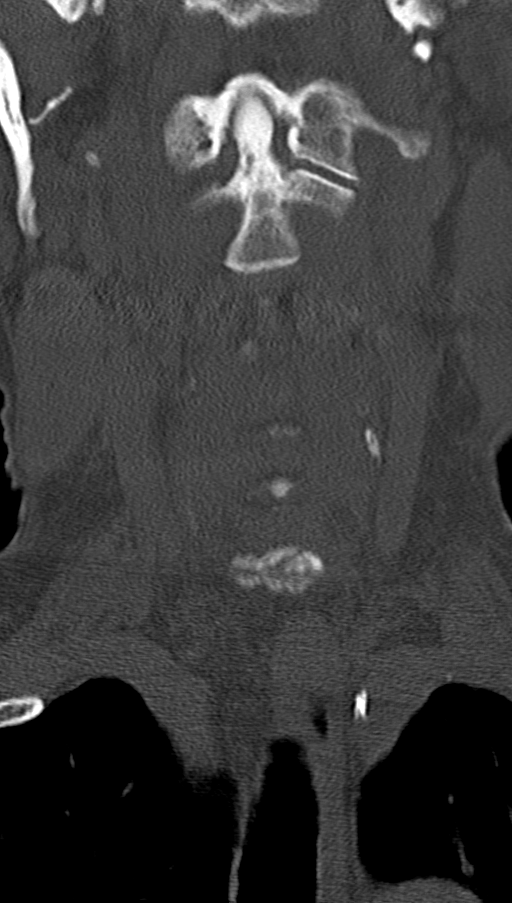
[im 19/47  bone]
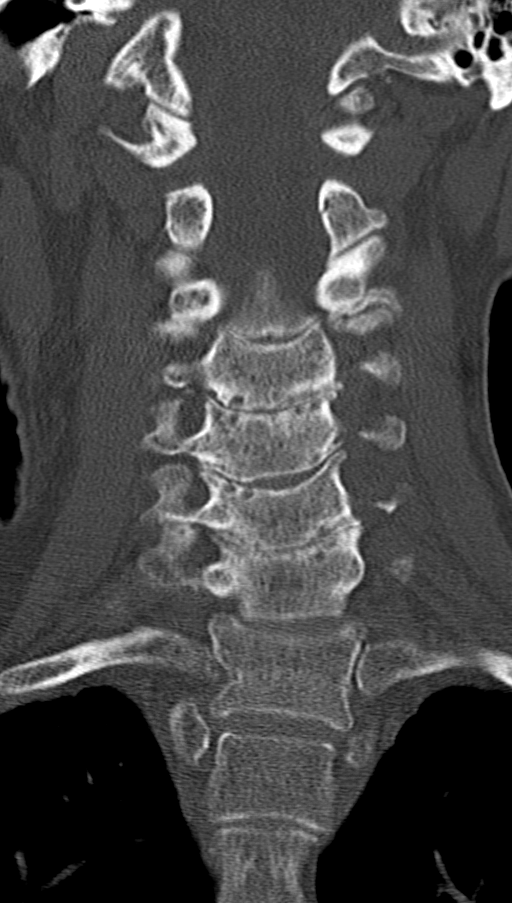
[im 28/47  bone]
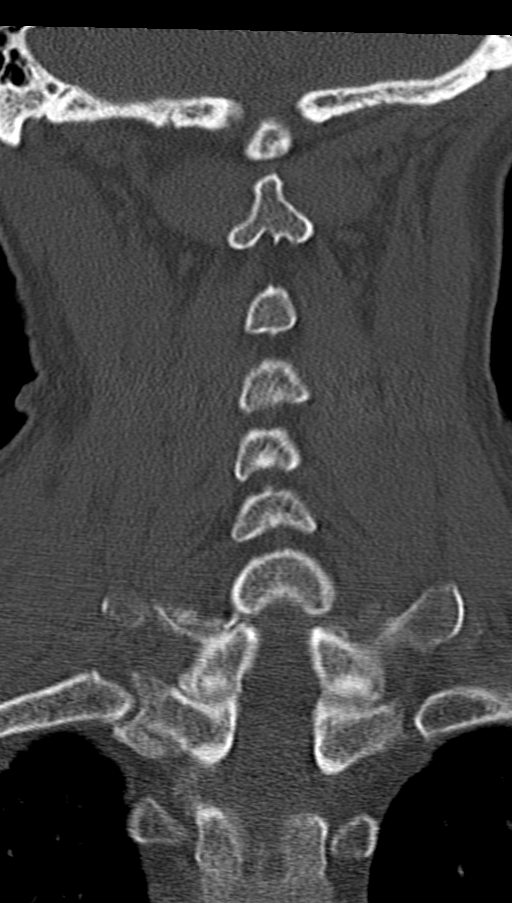

[Series 7: sagittal bone · sagittal · 0.20mm/px · 5 of 53 slices shown, 6 images]
[im 18/53  bone]
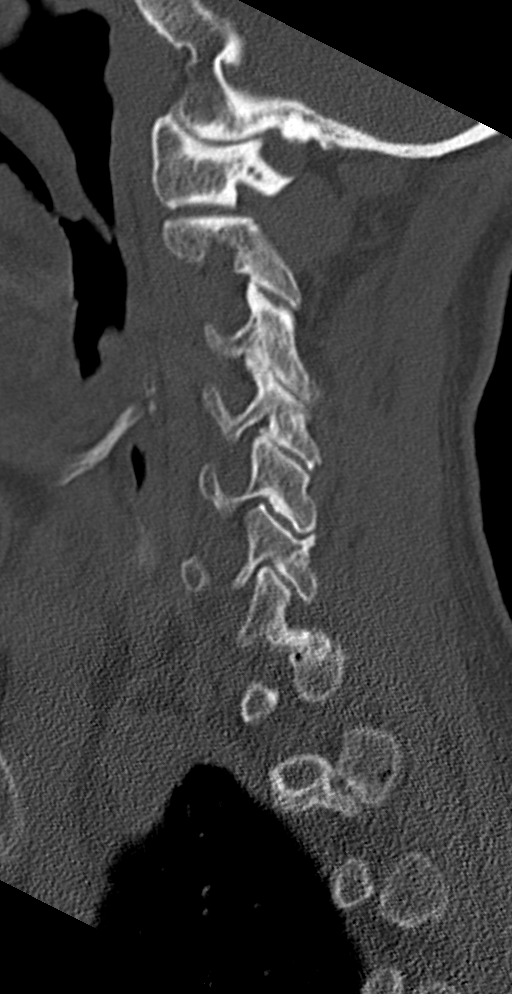
[im 22/53  bone]
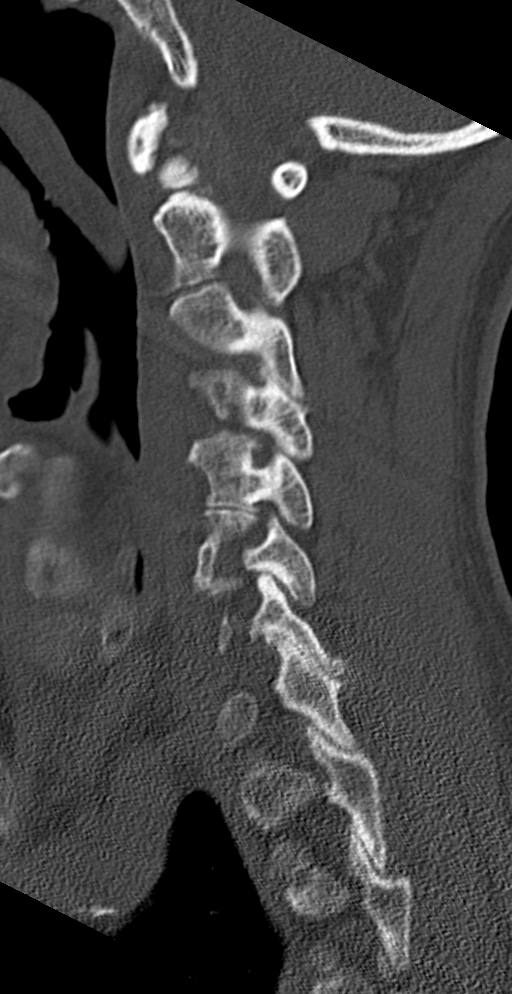
[im 27/53  soft-tissue]
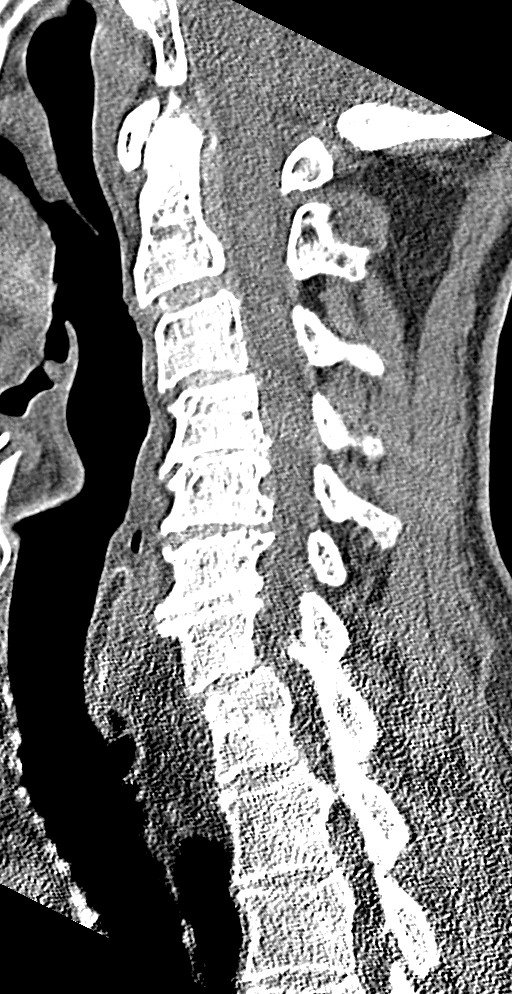
[im 27/53  bone]
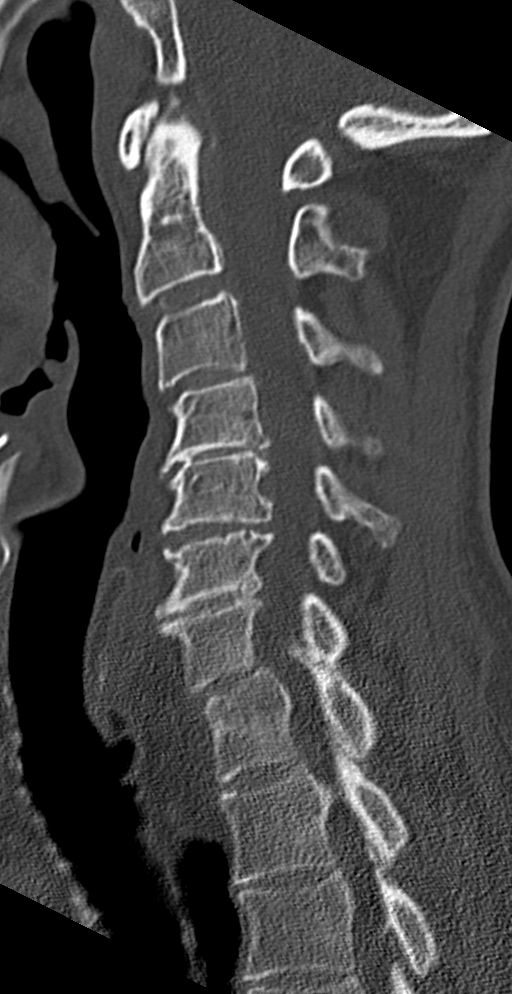
[im 31/53  bone]
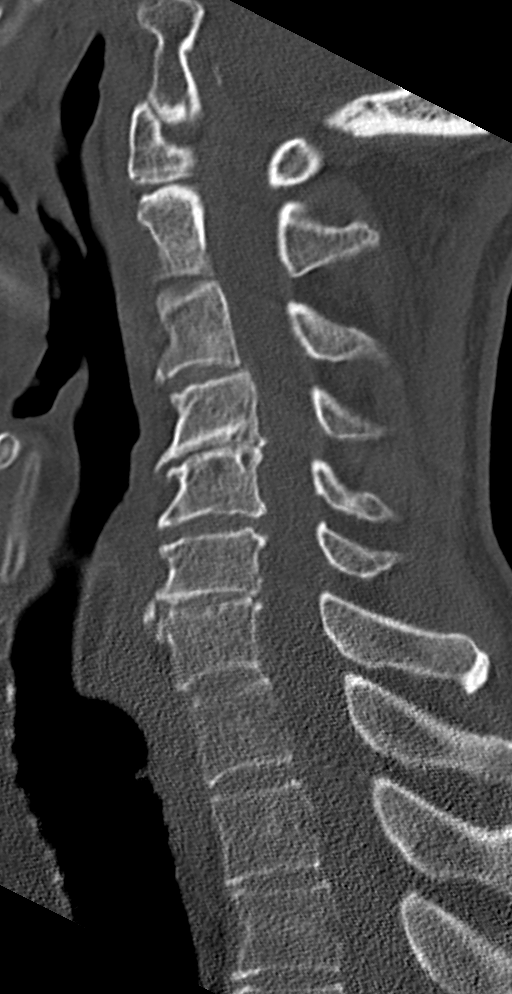
[im 35/53  bone]
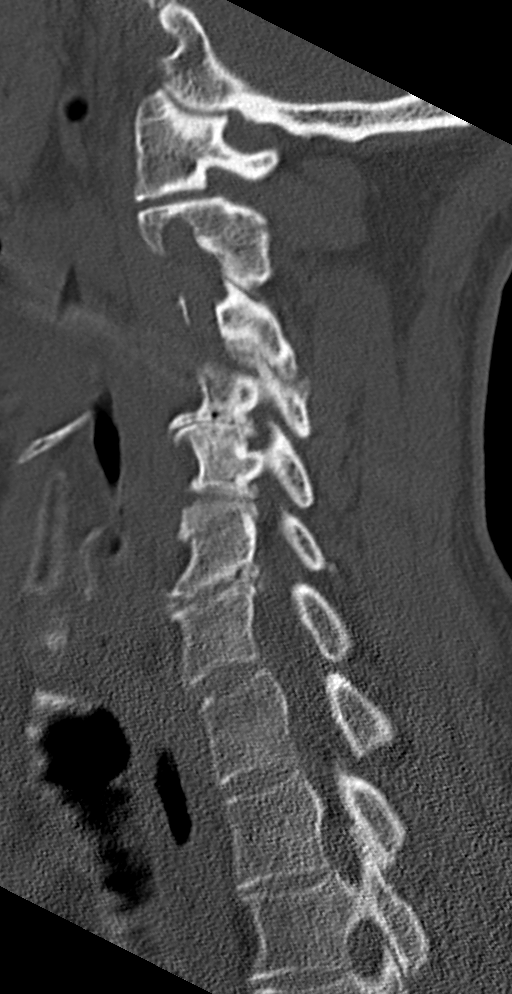

[9 of 33 positions shown; findings below may reference images not displayed]

FINDINGS: CT HEAD FINDINGS

Brain: No evidence of acute infarction, hemorrhage, hydrocephalus,
extra-axial collection or mass lesion/mass effect. Marked cortical
and central atrophy with ex vacuo dilatation of the lateral
ventricles. Confluent periventricular and subcortical white matter
hypoattenuation consistent with advanced chronic microvascular
ischemic white matter changes.

Vascular: No hyperdense vessel or unexpected calcification.

Skull: Normal. Negative for fracture or focal lesion.

Sinuses/Orbits: No acute finding.

Other: None.

CT CERVICAL SPINE FINDINGS

Alignment: Normal.

Skull base and vertebrae: No acute fracture. No primary bone lesion
or focal pathologic process.

Soft tissues and spinal canal: No prevertebral fluid or swelling. No
visible canal hematoma.

Disc levels: Multilevel cervical spondylosis. Minimal degenerative
anterolisthesis of C3 on C4. Loss of disc space height and
osteophyte production most significant at C4-C5 and C6-C7. Bilateral
facet arthropathy at C4-C5.

Upper chest: Mild centrilobular emphysema.  No acute abnormality.

Other: None
IMPRESSION: CT HEAD

1. No acute intracranial abnormality.
2. Atrophy and advanced chronic microvascular ischemic white matter
disease.

CT CSPINE

1. No acute fracture or malalignment.
2. Multilevel cervical spondylosis and facet arthropathy.
3. Mild centrilobular emphysema.  Emphysema ([id number]).
